# Patient Record
Sex: Female | Born: 1950 | Smoking: Never smoker
Health system: Southern US, Community
[De-identification: ages and names within clinical notes are randomized; demographics above are authoritative.]

## PROBLEM LIST (undated history)

## (undated) DIAGNOSIS — G709 Myoneural disorder, unspecified: Secondary | ICD-10-CM

## (undated) DIAGNOSIS — E119 Type 2 diabetes mellitus without complications: Secondary | ICD-10-CM

## (undated) DIAGNOSIS — I10 Essential (primary) hypertension: Secondary | ICD-10-CM

## (undated) DIAGNOSIS — C801 Malignant (primary) neoplasm, unspecified: Secondary | ICD-10-CM

## (undated) DIAGNOSIS — M1990 Unspecified osteoarthritis, unspecified site: Secondary | ICD-10-CM

## (undated) HISTORY — PX: CARPAL TUNNEL RELEASE: SHX101

## (undated) HISTORY — PX: JOINT REPLACEMENT: SHX530

## (undated) HISTORY — PX: ABDOMINAL HYSTERECTOMY: SHX81

## (undated) HISTORY — PX: OTHER SURGICAL HISTORY: SHX169

## (undated) HISTORY — PX: CHOLECYSTECTOMY: SHX55

---

## 2016-03-26 HISTORY — PX: EYE SURGERY: SHX253

## 2018-02-25 ENCOUNTER — Other Ambulatory Visit: Payer: Self-pay | Admitting: Neurological Surgery

## 2018-03-20 NOTE — Pre-Procedure Instructions (Signed)
Markeeta Scalf  03/20/2018      [place], [place] [address] Phone: [phone] Fax: [phone]    Your procedure is scheduled on March 28, 2018.  Report to [facility] Admitting at 1030 AM.  Call this number if you have problems the morning of surgery:  [phone]   Remember:  Do not eat or drink after midnight.    Take these medicines the morning of surgery with A SIP OF WATER  Cetirizine (Zyrtec) Sertraline (Zoloft)  7 days prior to surgery STOP taking any Aspirin (unless otherwise instructed by your surgeon), diclfenac (voltaren), Aleve, Naproxen, Ibuprofen, Motrin, Advil, Goody's, BC's, all herbal medications, fish oil, and all vitamins  WHAT DO I DO ABOUT MY DIABETES MEDICATION?  Marland Kitchen Do not take oral diabetes medicines (pills) the morning of surgery glimepiride (amaryl).  THE NIGHT BEFORE SURGERY, do not take evening dose of glimepiride.   . The day of surgery, do not take other diabetes injectables, including Byetta (exenatide), Bydureon (exenatide ER), Victoza (liraglutide), or Trulicity (dulaglutide).  Reviewed and Endorsed by [facility] Patient Education Committee, August 2015  How to Manage Your Diabetes Before and After Surgery  Why is it important to control my blood sugar before and after surgery? . Improving blood sugar levels before and after surgery helps healing and can limit problems. . A way of improving blood sugar control is eating a healthy diet by: o  Eating less sugar and carbohydrates o  Increasing activity/exercise o  Talking with your doctor about reaching your blood sugar goals . High blood sugars (greater than 180 mg/dL) can raise your risk of infections and slow your recovery, so you will need to focus on controlling your diabetes during the weeks before surgery. . Make sure that the doctor who takes care of your diabetes knows  about your planned surgery including the date and location.  How do I manage my blood sugar before surgery? . Check your blood sugar at least 4 times a day, starting 2 days before surgery, to make sure that the level is not too high or low. o Check your blood sugar the morning of your surgery when you wake up and every 2 hours until you get to the Short Stay unit. . If your blood sugar is less than 70 mg/dL, you will need to treat for low blood sugar: o Do not take insulin. o Treat a low blood sugar (less than 70 mg/dL) with  cup of clear juice (cranberry or apple), 4 glucose tablets, OR glucose gel. Recheck blood sugar in 15 minutes after treatment (to make sure it is greater than 70 mg/dL). If your blood sugar is not greater than 70 mg/dL on recheck, call [phone] o  for further instructions. . Report your blood sugar to the short stay nurse when you get to Short Stay.  . If you are admitted to the hospital after surgery: o Your blood sugar will be checked by the staff and you will probably be given insulin after surgery (instead of oral diabetes medicines) to make sure you have good blood sugar levels. o The goal for blood sugar control after surgery is 80-180 mg/dL.   [facility]- Preparing For Surgery  Before surgery, you can play an important role. Because skin is not sterile, your skin needs to be as free of germs as possible. You can reduce the number of germs  on your skin by washing with CHG (chlorahexidine gluconate) Soap before surgery.  CHG is an antiseptic cleaner which kills germs and bonds with the skin to continue killing germs even after washing.    Oral Hygiene is also important to reduce your risk of infection.  Remember - BRUSH YOUR TEETH THE MORNING OF SURGERY WITH YOUR REGULAR TOOTHPASTE  Please do not use if you have an allergy to CHG or antibacterial soaps. If your skin becomes reddened/irritated stop using the CHG.  Do not shave (including legs and underarms) for  at least 48 hours prior to first CHG shower. It is OK to shave your face.  Please follow these instructions carefully.   1. Shower the NIGHT BEFORE SURGERY and the MORNING OF SURGERY with CHG.   2. If you chose to wash your hair, wash your hair first as usual with your normal shampoo.  3. After you shampoo, rinse your hair and body thoroughly to remove the shampoo.  4. Use CHG as you would any other liquid soap. You can apply CHG directly to the skin and wash gently with a scrungie or a clean washcloth.   5. Apply the CHG Soap to your body ONLY FROM THE NECK DOWN.  Do not use on open wounds or open sores. Avoid contact with your eyes, ears, mouth and genitals (private parts). Wash Face and genitals (private parts)  with your normal soap.  6. Wash thoroughly, paying special attention to the area where your surgery will be performed.  7. Thoroughly rinse your body with warm water from the neck down.  8. DO NOT shower/wash with your normal soap after using and rinsing off the CHG Soap.  9. Pat yourself dry with a CLEAN TOWEL.  10. Wear CLEAN PAJAMAS to bed the night before surgery, wear comfortable clothes the morning of surgery  11. Place CLEAN SHEETS on your bed the night of your first shower and DO NOT SLEEP WITH PETS.  Day of Surgery:  Do not apply any deodorants/lotions.  Please wear clean clothes to the hospital/surgery center.   Remember to brush your teeth WITH YOUR REGULAR TOOTHPASTE.    Do not wear jewelry, make-up or nail polish.  Do not wear lotions, powders, or perfumes, or deodorant.  Do not shave 48 hours prior to surgery.    Do not bring valuables to the hospital.  [facility] is not responsible for any belongings or valuables.  Contacts, dentures or bridgework may not be worn into surgery.  Leave your suitcase in the car.  After surgery it may be brought to your room.  For patients admitted to the hospital, discharge time will be determined by your treatment  team.  Patients discharged the day of surgery will not be allowed to drive home.   Please read over the following fact sheets that you were given.

## 2018-03-20 NOTE — Progress Notes (Addendum)
PCP -  Ardith Dark, PA-C Cardiologist - pt denies  Chest x-ray - 03/21/18 at PAT appointment EKG - 03/21/18 at PAT appt  Stress Test -  Pt denies ECHO - pt denies  Cardiac Cath - pt denies  Sleep Study - maybe in 1990s CPAP - n/a  Fasting Blood Sugar - 100s Checks Blood Sugar 0 times a day-doesn't test at home Dr. Posey Pronto at [facility]   Blood Thinner Instructions: pt denies Aspirin Instructions: pt denies  Anesthesia review:   Patient denies shortness of breath, fever, cough and chest pain at PAT appointment  Patient verbalized understanding of instructions that were given to them at the PAT appointment. Patient was also instructed that they will need to review over the PAT instructions again at home before surgery.

## 2018-03-21 ENCOUNTER — Encounter (HOSPITAL_COMMUNITY)
Admission: RE | Admit: 2018-03-21 | Discharge: 2018-03-21 | Disposition: A | Discharge status: Home / Self Care | Payer: Medicare HMO | Source: Ambulatory Visit | Attending: Neurological Surgery | Admitting: Neurological Surgery

## 2018-03-21 ENCOUNTER — Encounter (HOSPITAL_COMMUNITY): Payer: Self-pay

## 2018-03-21 ENCOUNTER — Other Ambulatory Visit: Payer: Self-pay

## 2018-03-21 DIAGNOSIS — M4310 Spondylolisthesis, site unspecified: Secondary | ICD-10-CM | POA: Insufficient documentation

## 2018-03-21 DIAGNOSIS — Z7984 Long term (current) use of oral hypoglycemic drugs: Secondary | ICD-10-CM | POA: Diagnosis not present

## 2018-03-21 DIAGNOSIS — E119 Type 2 diabetes mellitus without complications: Secondary | ICD-10-CM | POA: Diagnosis not present

## 2018-03-21 DIAGNOSIS — I10 Essential (primary) hypertension: Secondary | ICD-10-CM | POA: Diagnosis not present

## 2018-03-21 DIAGNOSIS — Z79899 Other long term (current) drug therapy: Secondary | ICD-10-CM | POA: Insufficient documentation

## 2018-03-21 DIAGNOSIS — Z01818 Encounter for other preprocedural examination: Secondary | ICD-10-CM | POA: Diagnosis not present

## 2018-03-21 HISTORY — DX: Essential (primary) hypertension: I10

## 2018-03-21 HISTORY — DX: Unspecified osteoarthritis, unspecified site: M19.90

## 2018-03-21 HISTORY — DX: Type 2 diabetes mellitus without complications: E11.9

## 2018-03-21 HISTORY — DX: Myoneural disorder, unspecified: G70.9

## 2018-03-21 HISTORY — DX: Malignant (primary) neoplasm, unspecified: C80.1

## 2018-03-21 LAB — CBC WITH DIFFERENTIAL/PLATELET
Abs Immature Granulocytes: 0.02 10*3/uL (ref 0.00–0.07)
BASOS PCT: 1 %
Basophils Absolute: 0.1 10*3/uL (ref 0.0–0.1)
EOS ABS: 0.3 10*3/uL (ref 0.0–0.5)
Eosinophils Relative: 3 %
HCT: 43 % (ref 36.0–46.0)
Hemoglobin: 13.6 g/dL (ref 12.0–15.0)
Immature Granulocytes: 0 %
Lymphocytes Relative: 25 %
Lymphs Abs: 2.3 10*3/uL (ref 0.7–4.0)
MCH: 27.3 pg (ref 26.0–34.0)
MCHC: 31.6 g/dL (ref 30.0–36.0)
MCV: 86.3 fL (ref 80.0–100.0)
Monocytes Absolute: 0.6 10*3/uL (ref 0.1–1.0)
Monocytes Relative: 6 %
Neutro Abs: 6 10*3/uL (ref 1.7–7.7)
Neutrophils Relative %: 65 %
PLATELETS: 334 10*3/uL (ref 150–400)
RBC: 4.98 MIL/uL (ref 3.87–5.11)
RDW: 12.8 % (ref 11.5–15.5)
WBC: 9.2 10*3/uL (ref 4.0–10.5)
nRBC: 0 % (ref 0.0–0.2)

## 2018-03-21 LAB — GLUCOSE, CAPILLARY: Glucose-Capillary: 125 mg/dL — ABNORMAL HIGH (ref 70–99)

## 2018-03-21 LAB — TYPE AND SCREEN
ABO/RH(D): A POS
Antibody Screen: NEGATIVE

## 2018-03-21 LAB — BASIC METABOLIC PANEL
Anion gap: 10 (ref 5–15)
BUN: 17 mg/dL (ref 8–23)
CALCIUM: 9.2 mg/dL (ref 8.9–10.3)
CO2: 24 mmol/L (ref 22–32)
Chloride: 106 mmol/L (ref 98–111)
Creatinine, Ser: 0.93 mg/dL (ref 0.44–1.00)
GFR calc Af Amer: >60 mL/min (ref >60)
GFR calc non Af Amer: >60 mL/min (ref >60)
Glucose, Bld: 141 mg/dL — ABNORMAL HIGH (ref 70–99)
Potassium: 4.5 mmol/L (ref 3.5–5.1)
Sodium: 140 mmol/L (ref 135–145)

## 2018-03-21 LAB — HEMOGLOBIN A1C
Hgb A1c MFr Bld: 7 % — ABNORMAL HIGH (ref 4.8–5.6)
Mean Plasma Glucose: 154.2 mg/dL

## 2018-03-21 LAB — PROTIME-INR
INR: 1.01
Prothrombin Time: 13.2 seconds (ref 11.4–15.2)

## 2018-03-21 LAB — SURGICAL PCR SCREEN
MRSA, PCR: NEGATIVE
Staphylococcus aureus: NEGATIVE

## 2018-03-21 LAB — ABO/RH: ABO/RH(D): A POS

## 2018-03-27 ENCOUNTER — Encounter (HOSPITAL_COMMUNITY): Payer: Self-pay | Admitting: Anesthesiology

## 2018-03-27 NOTE — Anesthesia Preprocedure Evaluation (Addendum)
Anesthesia Evaluation  Patient identified by MRN, date of birth, ID band Patient awake    Reviewed: Allergy & Precautions, NPO status , Patient's Chart, lab work & pertinent test results  Airway Mallampati: I       Dental  (+) Lower Dentures, Upper Dentures, Dental Advisory Given, Implants   Pulmonary    Pulmonary exam normal breath sounds clear to auscultation       Cardiovascular hypertension, Pt. on medications Normal cardiovascular exam Rhythm:Regular Rate:Normal     Neuro/Psych    GI/Hepatic negative GI ROS, Neg liver ROS,   Endo/Other  diabetes, Oral Hypoglycemic Agents  Renal/GU negative Renal ROS     Musculoskeletal   Abdominal (+) + obese,   Peds  Hematology   Anesthesia Other Findings   Reproductive/Obstetrics                           Anesthesia Physical Anesthesia Plan  ASA: II  Anesthesia Plan: General   Post-op Pain Management:    Induction: Intravenous  PONV Risk Score and Plan: 4 or greater and Ondansetron, Midazolam and Treatment may vary due to age or medical condition  Airway Management Planned: Oral ETT  Additional Equipment:   Intra-op Plan:   Post-operative Plan: Extubation in OR  Informed Consent: I have reviewed the patients History and Physical, chart, labs and discussed the procedure including the risks, benefits and alternatives for the proposed anesthesia with the patient or authorized representative who has indicated his/her understanding and acceptance.   Dental advisory given  Plan Discussed with: CRNA  Anesthesia Plan Comments:        Anesthesia Quick Evaluation

## 2018-03-28 ENCOUNTER — Inpatient Hospital Stay (HOSPITAL_COMMUNITY): Payer: Medicare HMO

## 2018-03-28 ENCOUNTER — Encounter (HOSPITAL_COMMUNITY): Payer: Self-pay

## 2018-03-28 ENCOUNTER — Inpatient Hospital Stay (HOSPITAL_COMMUNITY)
Admission: RE | Admit: 2018-03-28 | Discharge: 2018-03-29 | DRG: 455 | Disposition: A | Discharge status: Home / Self Care | Payer: Medicare HMO | Attending: Neurological Surgery | Admitting: Neurological Surgery

## 2018-03-28 ENCOUNTER — Encounter (HOSPITAL_COMMUNITY)
Admission: RE | Disposition: A | Discharge status: Home / Self Care | Payer: Self-pay | Source: Home / Self Care | Attending: Neurological Surgery

## 2018-03-28 ENCOUNTER — Inpatient Hospital Stay (HOSPITAL_COMMUNITY): Payer: Medicare HMO | Admitting: Certified Registered Nurse Anesthetist

## 2018-03-28 ENCOUNTER — Other Ambulatory Visit: Payer: Self-pay

## 2018-03-28 DIAGNOSIS — E119 Type 2 diabetes mellitus without complications: Secondary | ICD-10-CM | POA: Diagnosis present

## 2018-03-28 DIAGNOSIS — I10 Essential (primary) hypertension: Secondary | ICD-10-CM | POA: Diagnosis present

## 2018-03-28 DIAGNOSIS — Z79899 Other long term (current) drug therapy: Secondary | ICD-10-CM | POA: Diagnosis not present

## 2018-03-28 DIAGNOSIS — M48061 Spinal stenosis, lumbar region without neurogenic claudication: Secondary | ICD-10-CM | POA: Diagnosis present

## 2018-03-28 DIAGNOSIS — M5116 Intervertebral disc disorders with radiculopathy, lumbar region: Secondary | ICD-10-CM | POA: Diagnosis present

## 2018-03-28 DIAGNOSIS — M4316 Spondylolisthesis, lumbar region: Principal | ICD-10-CM | POA: Diagnosis present

## 2018-03-28 DIAGNOSIS — Z7984 Long term (current) use of oral hypoglycemic drugs: Secondary | ICD-10-CM

## 2018-03-28 DIAGNOSIS — Z85828 Personal history of other malignant neoplasm of skin: Secondary | ICD-10-CM

## 2018-03-28 DIAGNOSIS — Z981 Arthrodesis status: Secondary | ICD-10-CM

## 2018-03-28 DIAGNOSIS — Z419 Encounter for procedure for purposes other than remedying health state, unspecified: Secondary | ICD-10-CM

## 2018-03-28 LAB — GLUCOSE, CAPILLARY
GLUCOSE-CAPILLARY: 150 mg/dL — AB (ref 70–99)
GLUCOSE-CAPILLARY: 224 mg/dL — AB (ref 70–99)
Glucose-Capillary: 331 mg/dL — ABNORMAL HIGH (ref 70–99)
Glucose-Capillary: 249 mg/dL — ABNORMAL HIGH (ref 70–99)

## 2018-03-28 SURGERY — POSTERIOR LUMBAR FUSION 2 LEVEL
Anesthesia: General | Site: Back

## 2018-03-28 MED ORDER — FENTANYL CITRATE (PF) 250 MCG/5ML IJ SOLN
INTRAMUSCULAR | Status: AC
  Filled 2018-03-28: qty 5

## 2018-03-28 MED ORDER — ROCURONIUM BROMIDE 50 MG/5ML IV SOSY
PREFILLED_SYRINGE | INTRAVENOUS | Status: AC
  Filled 2018-03-28: qty 5

## 2018-03-28 MED ORDER — CHLORHEXIDINE GLUCONATE CLOTH 2 % EX PADS: 6.0000 | MEDICATED_PAD | Freq: Once | CUTANEOUS | Status: DC

## 2018-03-28 MED ORDER — OXYCODONE HCL 5 MG PO TABS
5.0000 mg | ORAL_TABLET | ORAL | Status: DC | PRN
  Administered 2018-03-28 – 2018-03-29 (×5): 10 mg via ORAL
  Filled 2018-03-28 (×5): qty 2

## 2018-03-28 MED ORDER — MIDAZOLAM HCL 5 MG/5ML IJ SOLN
INTRAMUSCULAR | Status: DC | PRN
  Administered 2018-03-28: 2 mg via INTRAVENOUS

## 2018-03-28 MED ORDER — SODIUM CHLORIDE 0.9 % IV SOLN
INTRAVENOUS | Status: DC | PRN
  Administered 2018-03-28: 60 ug/min via INTRAVENOUS
  Administered 2018-03-28: 11:00:00 via INTRAVENOUS

## 2018-03-28 MED ORDER — MIDAZOLAM HCL 2 MG/2ML IJ SOLN
INTRAMUSCULAR | Status: AC
  Filled 2018-03-28: qty 2

## 2018-03-28 MED ORDER — VANCOMYCIN HCL 1000 MG IV SOLR
INTRAVENOUS | Status: AC
  Filled 2018-03-28: qty 1000

## 2018-03-28 MED ORDER — METHOCARBAMOL 500 MG PO TABS
500.0000 mg | ORAL_TABLET | Freq: Four times a day (QID) | ORAL | Status: DC | PRN
  Administered 2018-03-28 – 2018-03-29 (×4): 500 mg via ORAL
  Filled 2018-03-28 (×3): qty 1

## 2018-03-28 MED ORDER — ALBUMIN HUMAN 5 % IV SOLN
INTRAVENOUS | Status: DC | PRN
  Administered 2018-03-28 (×2): via INTRAVENOUS

## 2018-03-28 MED ORDER — SERTRALINE HCL 50 MG PO TABS: 50.0000 mg | ORAL_TABLET | Freq: Every day | ORAL | Status: DC

## 2018-03-28 MED ORDER — METHOCARBAMOL 500 MG PO TABS
ORAL_TABLET | ORAL | Status: AC
  Filled 2018-03-28: qty 1

## 2018-03-28 MED ORDER — SENNA 8.6 MG PO TABS
1.0000 | ORAL_TABLET | Freq: Two times a day (BID) | ORAL | Status: DC
  Administered 2018-03-28: 8.6 mg via ORAL
  Filled 2018-03-28: qty 1

## 2018-03-28 MED ORDER — DEXAMETHASONE SODIUM PHOSPHATE 4 MG/ML IJ SOLN
INTRAMUSCULAR | Status: DC | PRN
  Administered 2018-03-28: 10 mg via INTRAVENOUS

## 2018-03-28 MED ORDER — INSULIN ASPART 100 UNIT/ML ~~LOC~~ SOLN
0.0000 [IU] | Freq: Every day | SUBCUTANEOUS | Status: DC
  Administered 2018-03-28: 2 [IU] via SUBCUTANEOUS

## 2018-03-28 MED ORDER — THROMBIN 20000 UNITS EX SOLR
CUTANEOUS | Status: AC
  Filled 2018-03-28: qty 20000

## 2018-03-28 MED ORDER — PROPOFOL 10 MG/ML IV BOLUS
INTRAVENOUS | Status: DC | PRN
  Administered 2018-03-28: 120 mg via INTRAVENOUS

## 2018-03-28 MED ORDER — MENTHOL 3 MG MT LOZG: 1.0000 | LOZENGE | OROMUCOSAL | Status: DC | PRN

## 2018-03-28 MED ORDER — BUPIVACAINE HCL (PF) 0.25 % IJ SOLN
INTRAMUSCULAR | Status: AC
  Filled 2018-03-28: qty 30

## 2018-03-28 MED ORDER — CEFAZOLIN SODIUM-DEXTROSE 2-4 GM/100ML-% IV SOLN
2.0000 g | Freq: Three times a day (TID) | INTRAVENOUS | Status: AC
  Administered 2018-03-28 (×2): 2 g via INTRAVENOUS
  Filled 2018-03-28 (×2): qty 100

## 2018-03-28 MED ORDER — GLIMEPIRIDE 2 MG PO TABS
4.0000 mg | ORAL_TABLET | Freq: Two times a day (BID) | ORAL | Status: DC
  Administered 2018-03-29: 4 mg via ORAL
  Filled 2018-03-28: qty 2

## 2018-03-28 MED ORDER — ACETAMINOPHEN 10 MG/ML IV SOLN
INTRAVENOUS | Status: AC
  Filled 2018-03-28: qty 100

## 2018-03-28 MED ORDER — PHENYLEPHRINE 40 MCG/ML (10ML) SYRINGE FOR IV PUSH (FOR BLOOD PRESSURE SUPPORT)
PREFILLED_SYRINGE | INTRAVENOUS | Status: AC
  Filled 2018-03-28: qty 10

## 2018-03-28 MED ORDER — CELECOXIB 200 MG PO CAPS
200.0000 mg | ORAL_CAPSULE | Freq: Two times a day (BID) | ORAL | Status: DC
  Administered 2018-03-28 – 2018-03-29 (×3): 200 mg via ORAL
  Filled 2018-03-28 (×3): qty 1

## 2018-03-28 MED ORDER — ONDANSETRON HCL 4 MG/2ML IJ SOLN
INTRAMUSCULAR | Status: DC | PRN
  Administered 2018-03-28: 4 mg via INTRAVENOUS

## 2018-03-28 MED ORDER — HEPARIN SODIUM (PORCINE) 1000 UNIT/ML IJ SOLN
INTRAMUSCULAR | Status: AC
  Filled 2018-03-28: qty 1

## 2018-03-28 MED ORDER — PHENOL 1.4 % MT LIQD: 1.0000 | OROMUCOSAL | Status: DC | PRN

## 2018-03-28 MED ORDER — GLYCOPYRROLATE PF 0.2 MG/ML IJ SOSY
PREFILLED_SYRINGE | INTRAMUSCULAR | Status: AC
  Filled 2018-03-28: qty 1

## 2018-03-28 MED ORDER — KETOROLAC TROMETHAMINE 30 MG/ML IJ SOLN
30.0000 mg | Freq: Once | INTRAMUSCULAR | Status: AC | PRN
  Administered 2018-03-28: 30 mg via INTRAVENOUS

## 2018-03-28 MED ORDER — SODIUM CHLORIDE 0.9% FLUSH: 3.0000 mL | Freq: Two times a day (BID) | INTRAVENOUS | Status: DC

## 2018-03-28 MED ORDER — SODIUM CHLORIDE 0.9% FLUSH: 3.0000 mL | INTRAVENOUS | Status: DC | PRN

## 2018-03-28 MED ORDER — KETOROLAC TROMETHAMINE 30 MG/ML IJ SOLN
INTRAMUSCULAR | Status: AC
  Filled 2018-03-28: qty 1

## 2018-03-28 MED ORDER — PROMETHAZINE HCL 25 MG/ML IJ SOLN: 6.2500 mg | INTRAMUSCULAR | Status: DC | PRN

## 2018-03-28 MED ORDER — FENTANYL CITRATE (PF) 100 MCG/2ML IJ SOLN
INTRAMUSCULAR | Status: DC | PRN
  Administered 2018-03-28: 100 ug via INTRAVENOUS
  Administered 2018-03-28 (×2): 50 ug via INTRAVENOUS
  Administered 2018-03-28: 150 ug via INTRAVENOUS
  Administered 2018-03-28: 50 ug via INTRAVENOUS

## 2018-03-28 MED ORDER — ONDANSETRON HCL 4 MG/2ML IJ SOLN
INTRAMUSCULAR | Status: AC
  Filled 2018-03-28: qty 2

## 2018-03-28 MED ORDER — LACTATED RINGERS IV SOLN
INTRAVENOUS | Status: DC | PRN
  Administered 2018-03-28 (×2): via INTRAVENOUS

## 2018-03-28 MED ORDER — METHOCARBAMOL 1000 MG/10ML IJ SOLN
500.0000 mg | Freq: Four times a day (QID) | INTRAVENOUS | Status: DC | PRN
  Filled 2018-03-28: qty 5

## 2018-03-28 MED ORDER — DEXAMETHASONE SODIUM PHOSPHATE 10 MG/ML IJ SOLN
INTRAMUSCULAR | Status: AC
  Filled 2018-03-28: qty 1

## 2018-03-28 MED ORDER — LIDOCAINE 2% (20 MG/ML) 5 ML SYRINGE
INTRAMUSCULAR | Status: AC
  Filled 2018-03-28: qty 5

## 2018-03-28 MED ORDER — THROMBIN 20000 UNITS EX SOLR
CUTANEOUS | Status: DC | PRN
  Administered 2018-03-28: 07:00:00 via TOPICAL

## 2018-03-28 MED ORDER — ACETAMINOPHEN 10 MG/ML IV SOLN
INTRAVENOUS | Status: DC | PRN
  Administered 2018-03-28: 1000 mg via INTRAVENOUS

## 2018-03-28 MED ORDER — ACETAMINOPHEN 325 MG PO TABS: 650.0000 mg | ORAL_TABLET | ORAL | Status: DC | PRN

## 2018-03-28 MED ORDER — SODIUM CHLORIDE 0.9 % IV SOLN: 250.0000 mL | INTRAVENOUS | Status: DC

## 2018-03-28 MED ORDER — BUPIVACAINE HCL (PF) 0.25 % IJ SOLN
INTRAMUSCULAR | Status: DC | PRN
  Administered 2018-03-28: 12 mL

## 2018-03-28 MED ORDER — THROMBIN 5000 UNITS EX SOLR
CUTANEOUS | Status: AC
  Filled 2018-03-28: qty 5000

## 2018-03-28 MED ORDER — POTASSIUM CHLORIDE IN NACL 20-0.9 MEQ/L-% IV SOLN: INTRAVENOUS | Status: DC

## 2018-03-28 MED ORDER — OXYCODONE HCL 5 MG PO TABS: 5.0000 mg | ORAL_TABLET | ORAL | Status: DC | PRN

## 2018-03-28 MED ORDER — THROMBIN 5000 UNITS EX SOLR
OROMUCOSAL | Status: DC | PRN
  Administered 2018-03-28: 08:00:00 via TOPICAL

## 2018-03-28 MED ORDER — GLYCOPYRROLATE PF 0.2 MG/ML IJ SOSY
PREFILLED_SYRINGE | INTRAMUSCULAR | Status: DC | PRN
  Administered 2018-03-28: .1 mg via INTRAVENOUS

## 2018-03-28 MED ORDER — HYDROMORPHONE HCL 1 MG/ML IJ SOLN
0.2500 mg | INTRAMUSCULAR | Status: DC | PRN
  Administered 2018-03-28 (×2): 0.5 mg via INTRAVENOUS

## 2018-03-28 MED ORDER — LISINOPRIL 20 MG PO TABS: 20.0000 mg | ORAL_TABLET | Freq: Every day | ORAL | Status: DC

## 2018-03-28 MED ORDER — HYDROMORPHONE HCL 1 MG/ML IJ SOLN
INTRAMUSCULAR | Status: AC
  Filled 2018-03-28: qty 1

## 2018-03-28 MED ORDER — CEFAZOLIN SODIUM-DEXTROSE 2-4 GM/100ML-% IV SOLN
2.0000 g | INTRAVENOUS | Status: AC
  Administered 2018-03-28: 2 g via INTRAVENOUS
  Filled 2018-03-28: qty 100

## 2018-03-28 MED ORDER — MORPHINE SULFATE (PF) 2 MG/ML IV SOLN
2.0000 mg | INTRAVENOUS | Status: DC | PRN
  Administered 2018-03-28: 2 mg via INTRAVENOUS
  Filled 2018-03-28: qty 1

## 2018-03-28 MED ORDER — 0.9 % SODIUM CHLORIDE (POUR BTL) OPTIME
TOPICAL | Status: DC | PRN
  Administered 2018-03-28: 1000 mL

## 2018-03-28 MED ORDER — HEPARIN SODIUM (PORCINE) 1000 UNIT/ML IJ SOLN
INTRAMUSCULAR | Status: DC | PRN
  Administered 2018-03-28: 5000 [IU] via INTRAVENOUS

## 2018-03-28 MED ORDER — SODIUM CHLORIDE 0.9 % IV SOLN
INTRAVENOUS | Status: DC | PRN
  Administered 2018-03-28: 07:00:00

## 2018-03-28 MED ORDER — INSULIN ASPART 100 UNIT/ML ~~LOC~~ SOLN
0.0000 [IU] | Freq: Three times a day (TID) | SUBCUTANEOUS | Status: DC
  Administered 2018-03-28: 11 [IU] via SUBCUTANEOUS

## 2018-03-28 MED ORDER — MEPERIDINE HCL 50 MG/ML IJ SOLN: 6.2500 mg | INTRAMUSCULAR | Status: DC | PRN

## 2018-03-28 MED ORDER — SUGAMMADEX SODIUM 200 MG/2ML IV SOLN
INTRAVENOUS | Status: DC | PRN
  Administered 2018-03-28: 200 mg via INTRAVENOUS

## 2018-03-28 MED ORDER — ACETAMINOPHEN 650 MG RE SUPP: 650.0000 mg | RECTAL | Status: DC | PRN

## 2018-03-28 MED ORDER — DEXAMETHASONE SODIUM PHOSPHATE 10 MG/ML IJ SOLN
10.0000 mg | INTRAMUSCULAR | Status: DC
  Filled 2018-03-28: qty 1

## 2018-03-28 MED ORDER — VANCOMYCIN HCL 1000 MG IV SOLR
INTRAVENOUS | Status: DC | PRN
  Administered 2018-03-28: 1000 mg

## 2018-03-28 MED ORDER — ONDANSETRON HCL 4 MG PO TABS: 4.0000 mg | ORAL_TABLET | Freq: Four times a day (QID) | ORAL | Status: DC | PRN

## 2018-03-28 MED ORDER — EPHEDRINE 5 MG/ML INJ
INTRAVENOUS | Status: AC
  Filled 2018-03-28: qty 10

## 2018-03-28 MED ORDER — ONDANSETRON HCL 4 MG/2ML IJ SOLN: 4.0000 mg | Freq: Four times a day (QID) | INTRAMUSCULAR | Status: DC | PRN

## 2018-03-28 MED ORDER — PHENYLEPHRINE 40 MCG/ML (10ML) SYRINGE FOR IV PUSH (FOR BLOOD PRESSURE SUPPORT)
PREFILLED_SYRINGE | INTRAVENOUS | Status: DC | PRN
  Administered 2018-03-28 (×3): 80 ug via INTRAVENOUS
  Administered 2018-03-28: 120 ug via INTRAVENOUS
  Administered 2018-03-28 (×2): 80 ug via INTRAVENOUS

## 2018-03-28 MED ORDER — INSULIN ASPART 100 UNIT/ML ~~LOC~~ SOLN
0.0000 [IU] | Freq: Three times a day (TID) | SUBCUTANEOUS | Status: DC
  Administered 2018-03-29: 2 [IU] via SUBCUTANEOUS

## 2018-03-28 MED ORDER — ROCURONIUM BROMIDE 50 MG/5ML IV SOSY
PREFILLED_SYRINGE | INTRAVENOUS | Status: DC | PRN
  Administered 2018-03-28: 40 mg via INTRAVENOUS
  Administered 2018-03-28: 10 mg via INTRAVENOUS
  Administered 2018-03-28: 50 mg via INTRAVENOUS

## 2018-03-28 MED ORDER — SODIUM CHLORIDE (PF) 0.9 % IJ SOLN
INTRAMUSCULAR | Status: DC | PRN
  Administered 2018-03-28: 5 mL

## 2018-03-28 MED ORDER — PROPOFOL 10 MG/ML IV BOLUS
INTRAVENOUS | Status: AC
  Filled 2018-03-28: qty 20

## 2018-03-28 MED ORDER — LIDOCAINE 2% (20 MG/ML) 5 ML SYRINGE
INTRAMUSCULAR | Status: DC | PRN
  Administered 2018-03-28: 80 mg via INTRAVENOUS

## 2018-03-28 MED ORDER — EPHEDRINE SULFATE-NACL 50-0.9 MG/10ML-% IV SOSY
PREFILLED_SYRINGE | INTRAVENOUS | Status: DC | PRN
  Administered 2018-03-28: 10 mg via INTRAVENOUS
  Administered 2018-03-28: 5 mg via INTRAVENOUS

## 2018-03-28 SURGICAL SUPPLY — 71 items
BAG DECANTER FOR FLEXI CONT (MISCELLANEOUS) ×3 IMPLANT
BASKET BONE COLLECTION (BASKET) ×6 IMPLANT
BENZOIN TINCTURE PRP APPL 2/3 (GAUZE/BANDAGES/DRESSINGS) ×3 IMPLANT
BLADE CLIPPER SURG (BLADE) IMPLANT
BUR MATCHSTICK NEURO 3.0 LAGG (BURR) ×3 IMPLANT
CANISTER SUCT 3000ML PPV (MISCELLANEOUS) ×3 IMPLANT
CARTRIDGE OIL MAESTRO DRILL (MISCELLANEOUS) ×1 IMPLANT
CLOSURE WOUND 1/2 X4 (GAUZE/BANDAGES/DRESSINGS) ×1
CONT SPEC 4OZ CLIKSEAL STRL BL (MISCELLANEOUS) ×3 IMPLANT
COVER BACK TABLE 60X90IN (DRAPES) ×3 IMPLANT
COVER WAND RF STERILE (DRAPES) ×3 IMPLANT
DERMABOND ADVANCED (GAUZE/BANDAGES/DRESSINGS) ×2
DERMABOND ADVANCED .7 DNX12 (GAUZE/BANDAGES/DRESSINGS) ×1 IMPLANT
DIFFUSER DRILL AIR PNEUMATIC (MISCELLANEOUS) ×3 IMPLANT
DRAPE C-ARM 42X72 X-RAY (DRAPES) ×3 IMPLANT
DRAPE C-ARMOR (DRAPES) ×3 IMPLANT
DRAPE LAPAROTOMY 100X72X124 (DRAPES) ×3 IMPLANT
DRAPE POUCH INSTRU U-SHP 10X18 (DRAPES) ×3 IMPLANT
DRAPE SURG 17X23 STRL (DRAPES) ×3 IMPLANT
DRSG OPSITE POSTOP 4X6 (GAUZE/BANDAGES/DRESSINGS) ×3 IMPLANT
DURAPREP 26ML APPLICATOR (WOUND CARE) ×3 IMPLANT
ELECT REM PT RETURN 9FT ADLT (ELECTROSURGICAL) ×3
ELECTRODE REM PT RTRN 9FT ADLT (ELECTROSURGICAL) ×1 IMPLANT
EVACUATOR 1/8 PVC DRAIN (DRAIN) ×3 IMPLANT
GAUZE 4X4 16PLY RFD (DISPOSABLE) IMPLANT
GLOVE BIO SURGEON STRL SZ7 (GLOVE) IMPLANT
GLOVE BIO SURGEON STRL SZ8 (GLOVE) ×6 IMPLANT
GLOVE BIOGEL PI IND STRL 7.0 (GLOVE) IMPLANT
GLOVE BIOGEL PI IND STRL 7.5 (GLOVE) ×4 IMPLANT
GLOVE BIOGEL PI INDICATOR 7.0 (GLOVE)
GLOVE BIOGEL PI INDICATOR 7.5 (GLOVE) ×8
GLOVE INDICATOR 7.0 STRL GRN (GLOVE) ×9 IMPLANT
GOWN STRL REUS W/ TWL LRG LVL3 (GOWN DISPOSABLE) ×1 IMPLANT
GOWN STRL REUS W/ TWL XL LVL3 (GOWN DISPOSABLE) ×2 IMPLANT
GOWN STRL REUS W/TWL 2XL LVL3 (GOWN DISPOSABLE) IMPLANT
GOWN STRL REUS W/TWL LRG LVL3 (GOWN DISPOSABLE) ×2
GOWN STRL REUS W/TWL XL LVL3 (GOWN DISPOSABLE) ×4
HEMOSTAT POWDER KIT SURGIFOAM (HEMOSTASIS) ×3 IMPLANT
IDENTI PS 10X9X25 5D (Spacer) ×6 IMPLANT
IDENTI PS 11X9X25 15D (Spacer) ×6 IMPLANT
INTERLOCK LORDOTIC ROD 5.5 X70 (Rod) ×2 IMPLANT
KIT BASIN OR (CUSTOM PROCEDURE TRAY) ×3 IMPLANT
KIT BONE MRW ASP ANGEL CPRP (KITS) ×3 IMPLANT
KIT TURNOVER KIT B (KITS) ×3 IMPLANT
LORDOTIC ROD 5.5 X 70 (Rod) ×6 IMPLANT
MILL MEDIUM DISP (BLADE) ×3 IMPLANT
NEEDLE HYPO 25X1 1.5 SAFETY (NEEDLE) ×3 IMPLANT
NS IRRIG 1000ML POUR BTL (IV SOLUTION) ×3 IMPLANT
OIL CARTRIDGE MAESTRO DRILL (MISCELLANEOUS) ×3
PACK LAMINECTOMY NEURO (CUSTOM PROCEDURE TRAY) ×3 IMPLANT
PAD ARMBOARD 7.5X6 YLW CONV (MISCELLANEOUS) ×9 IMPLANT
PUTTY DBM ALLOSYNC PURE 10CC (Putty) ×6 IMPLANT
SCREW ILIAC PA 5.5X40 (Screw) ×6 IMPLANT
SCREW KODIAK 5.5X45 (Screw) ×6 IMPLANT
SCREW KODIAK 6.5X40 (Screw) ×6 IMPLANT
SET SCREW (Screw) ×12 IMPLANT
SET SCREW SPNE (Screw) ×6 IMPLANT
SPACER IDENTI PS 10X9X25 5D (Spacer) ×2 IMPLANT
SPACER IDENTI PS 11X9X25 15D (Spacer) ×2 IMPLANT
SPONGE LAP 4X18 RFD (DISPOSABLE) IMPLANT
SPONGE SURGIFOAM ABS GEL 100 (HEMOSTASIS) ×3 IMPLANT
STRIP CLOSURE SKIN 1/2X4 (GAUZE/BANDAGES/DRESSINGS) ×2 IMPLANT
SUT VIC AB 0 CT1 18XCR BRD8 (SUTURE) ×1 IMPLANT
SUT VIC AB 0 CT1 8-18 (SUTURE) ×2
SUT VIC AB 2-0 CP2 18 (SUTURE) ×3 IMPLANT
SUT VIC AB 3-0 SH 8-18 (SUTURE) ×6 IMPLANT
SYR CONTROL 10ML LL (SYRINGE) ×6 IMPLANT
TOWEL GREEN STERILE (TOWEL DISPOSABLE) ×3 IMPLANT
TOWEL GREEN STERILE FF (TOWEL DISPOSABLE) ×3 IMPLANT
TRAY FOLEY MTR SLVR 16FR STAT (SET/KITS/TRAYS/PACK) ×3 IMPLANT
WATER STERILE IRR 1000ML POUR (IV SOLUTION) ×3 IMPLANT

## 2018-03-28 NOTE — Anesthesia Procedure Notes (Signed)
Procedure Name: Intubation Date/Time: 03/28/2018 7:46 AM Performed by: Orlie Dakin, CRNA Pre-anesthesia Checklist: Patient identified, Emergency Drugs available, Suction available and Patient being monitored Patient Re-evaluated:Patient Re-evaluated prior to induction Oxygen Delivery Method: Circle system utilized Preoxygenation: Pre-oxygenation with 100% oxygen Induction Type: IV induction Ventilation: Mask ventilation without difficulty and Oral airway inserted - appropriate to patient size Laryngoscope Size: Sabra Heck and 3 Grade View: Grade I Tube type: Oral Tube size: 7.0 mm Number of attempts: 1 Airway Equipment and Method: Stylet Placement Confirmation: ETT inserted through vocal cords under direct vision,  positive ETCO2 and breath sounds checked- equal and bilateral Secured at: 22 cm Tube secured with: Tape Dental Injury: Teeth and Oropharynx as per pre-operative assessment  Comments: Noted lower front permanent implants to secure dentures.  Upper and lower dentures given to husband.

## 2018-03-28 NOTE — Op Note (Signed)
03/28/2018  11:32 AM  PATIENT:  Jacqueline Anderson  68 y.o. 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 Decompressive lumbar laminectomy L3 4 and L4-5 requiring more work than would be required for a simple exposure of the disk for PLIF in order to adequately decompress the neural elements and address the spinal stenosis 2. Posterior lumbar interbody fusion L3-4 and L4-5 using porous titanium interbody cages packed with morcellized allograft and autograft soaked with a bone marrow aspirate obtained through a separate fascial incision over the right iliac crest 3. Posterior fixation L3-L5 inclusive using Alphatec cortical pedicle screws.  4. Intertransverse arthrodesis L3-L5 using morcellized autograft and allograft.  SURGEON:  Sherley Bounds, MD  ASSISTANTS: Glenford Peers FNP  ANESTHESIA:  General  EBL: 400 ml  Total I/O In: 2000 [I.V.:1500; IV Piggyback:500] Out: 61 [Urine:125; Blood:400]  BLOOD ADMINISTERED:none  DRAINS: none   INDICATION FOR PROCEDURE: This patient presented with severe back and bilateral leg pain that was chronic and refractory to medical measures. 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 I recommended decompression and instrumented fusion to address the stenosis as well as the segmental  instability.  Patient understood the risks, benefits, and alternatives and potential outcomes and wished to proceed.  PROCEDURE DETAILS:  The patient was brought to the operating room. After induction of generalized endotracheal anesthesia the patient was rolled into the prone position on chest rolls and all pressure points were padded. The  patient's lumbar region was cleaned and then prepped with DuraPrep and draped in the usual sterile fashion. Anesthesia was injected and then a dorsal midline incision was made and carried down to the lumbosacral fascia. The fascia was opened and the paraspinous musculature was taken down in a subperiosteal fashion to expose L3 4 and L4-5. A self-retaining retractor was placed. Intraoperative fluoroscopy confirmed my level, and I started with placement of the L3 cortical pedicle screws. The pedicle screw entry zones were identified utilizing surface landmarks and  AP and lateral fluoroscopy. I scored the cortex with the high-speed drill and then used the hand drill to drill an upward and outward direction into the pedicle. I then tapped line to line. I then placed a 5.5 x 40 mm cortical pedicle screw into the pedicles of L3 bilaterally.  I then dissected in a suprafascial plane to expose the iliac crest.  Open the fascia used a Jamshidi needle to extract 60 cc of bone marrow aspirate from the iliac crest.  This was then spun down by [facility] device and 2 to 4 cc of  BMAC was soaked on morselized allograft for later arthrodesis.  I dried the hole with Surgifoam and closed the fascia.  I then turned my attention to the decompression and complete lumbar laminectomies, hemi- facetectomies, and foraminotomies were performed at L3-4 and L4-5. The patient had significant spinal stenosis and this required more work than would be required for a simple exposure of the disc for posterior lumbar interbody fusion which would only require a limited laminotomy. Much more generous decompression and generous foraminotomy was undertaken in order to adequately decompress the neural elements and address the patient's leg pain. The yellow ligament was removed to expose the underlying dura and nerve roots, and generous foraminotomies were performed to adequately decompress the  neural elements. Both the exiting and traversing nerve roots were  decompressed on both sides until a coronary dilator passed easily along the nerve roots. Once the decompression was complete, I turned my attention to the posterior lower lumbar interbody fusion. The epidural venous vasculature was coagulated and cut sharply. Disc space was incised and the initial discectomy was performed with pituitary rongeurs. The disc space was distracted with sequential distractors to a height of 10 mm at L3-4 and 11 mm at L4-5. We then used a series of scrapers and shavers to prepare the endplates for fusion. The midline was prepared with Epstein curettes. Once the complete discectomy was finished, we packed an appropriate sized interbody cage with local autograft and morcellized allograft, gently retracted the nerve root, and tapped the cage into position at L3-4 and L4-5.  The midline between the cages was packed with morselized autograft and allograft. We then turned our attention to the placement of the lower pedicle screws. The pedicle screw entry zones were identified utilizing surface landmarks and fluoroscopy. I drilled into each pedicle utilizing the hand drill, and tapped each pedicle with the appropriate tap. We palpated with a ball probe to assure no break in the cortex. We then placed 5.5 x 40 mm cortical pedicle screws at L4 and a 6.5 x 40 mm pedicle screws at L5 bilaterally. . We then decorticated the transverse processes and laid a mixture of morcellized autograft and allograft out over these to perform intertransverse arthrodesis at L3 to L5. We then placed lordotic rods into the multiaxial screw heads of the pedicle screws and locked these in position with the locking caps and anti-torque device. We then checked our construct with AP and lateral fluoroscopy. Irrigated with copious amounts of bacitracin-containing saline solution. Inspected the nerve roots once again to assure adequate decompression, lined to the dura with Gelfoam, placed powdered vancomycin into the wound,  and closed the muscle and the fascia with 0 Vicryl. Closed the subcutaneous tissues with 2-0 Vicryl and subcuticular tissues with 3-0 Vicryl. The skin was closed with benzoin and Steri-Strips. Dressing was then applied, the patient was awakened from general anesthesia and transported to the recovery room in stable condition. At the end of the procedure all sponge, needle and instrument counts were correct.   PLAN OF CARE: admit to inpatient  PATIENT DISPOSITION:  PACU - hemodynamically stable.   Delay start of Pharmacological VTE agent (>24hrs) due to surgical blood loss or risk of bleeding:  yes

## 2018-03-28 NOTE — Anesthesia Postprocedure Evaluation (Signed)
Anesthesia Post Note  Patient: Jacqueline Anderson  Procedure(s) Performed: Posterior Lumbar Interbody Fusion - Lumbar three-four Lumbar four- Lumbar five - Posterior Lateral  Interbody fusion (N/A Back)     Patient location during evaluation: PACU Anesthesia Type: General Level of consciousness: awake Pain management: pain level controlled Vital Signs Assessment: post-procedure vital signs reviewed and stable Respiratory status: spontaneous breathing Cardiovascular status: stable Postop Assessment: no apparent nausea or vomiting Anesthetic complications: no    Last Vitals:  Vitals:   03/28/18 1215 03/28/18 1220  BP:    Pulse: (!) 105 (!) 106  Resp: 20 13  Temp: 36.7 C   SpO2: 93% 91%    Last Pain:  Vitals:   03/28/18 1215  PainSc: Asleep   Pain Goal:                 Huston Foley

## 2018-03-28 NOTE — OR Nursing (Signed)
ACD-A 10 ML EXPIRES 01/22 USED WITH ARTHREX MACHINE

## 2018-03-28 NOTE — Transfer of Care (Signed)
Immediate Anesthesia Transfer of Care Note  Patient: Jacqueline Anderson  Procedure(s) Performed: Posterior Lumbar Interbody Fusion - Lumbar three-four Lumbar four- Lumbar five - Posterior Lateral  Interbody fusion (N/A Back)  Patient Location: PACU  Anesthesia Type:General  Level of Consciousness: awake, oriented and patient cooperative  Airway & Oxygen Therapy: Patient Spontanous Breathing and Patient connected to face mask oxygen  Post-op Assessment: Report given to RN, Post -op Vital signs reviewed and stable and Patient moving all extremities X 4  Post vital signs: Reviewed and stable  Last Vitals:  Vitals Value Taken Time  BP 105/63 03/28/2018 11:41 AM  Temp    Pulse 109 03/28/2018 11:42 AM  Resp 22 03/28/2018 11:42 AM  SpO2 98 % 03/28/2018 11:42 AM  Vitals shown include unvalidated device data.  Last Pain:  Vitals:   03/28/18 0620  PainSc: 0-No pain         Complications: No apparent anesthesia complications

## 2018-03-28 NOTE — H&P (Signed)
Subjective: Patient is a 68 y.o. female admitted for plif. Onset of symptoms was several months ago, gradually worsening since that time.  The pain is rated severe, and is located at the across the lower back and radiates to legs. The pain is described as aching and occurs all day. The symptoms have been progressive. Symptoms are exacerbated by exercise and standing. MRI or CT showed spondylolisthesis/ stenosis L3-4 l4-5   Past Medical History:  Diagnosis Date  . Arthritis   . Cancer ([place])    skin cancers removed  . Diabetes mellitus without complication ([place])   . Hypertension   . Neuromuscular disorder [facility])     Past Surgical History:  Procedure Laterality Date  . ABDOMINAL HYSTERECTOMY    . CARPAL TUNNEL RELEASE Bilateral   . CHOLECYSTECTOMY    . excision of skin cancer     2019 on head  . EYE SURGERY Right 2018  . JOINT REPLACEMENT Left     Prior to Admission medications   Medication Sig Start Date End Date Taking? Authorizing Provider  cetirizine (ZYRTEC) 10 MG tablet Take 10 mg by mouth daily.   Yes [provider]  diclofenac (VOLTAREN) 75 MG EC tablet Take 75 mg by mouth 2 (two) times daily.   Yes [provider]  Dulaglutide (TRULICITY) 1.5 EN/2.7PO SOPN Inject 1.5 mg into the skin every 7 (seven) days.   Yes [provider]  glimepiride (AMARYL) 4 MG tablet Take 4 mg by mouth 2 (two) times daily.   Yes [provider]  lisinopril (PRINIVIL,ZESTRIL) 20 MG tablet Take 20 mg by mouth daily.   Yes [provider]  pravastatin (PRAVACHOL) 10 MG tablet Take 10 mg by mouth daily.   Yes [provider]  sertraline (ZOLOFT) 50 MG tablet Take 50-100 mg by mouth daily.    Yes [provider]   No Known Allergies  Social History   Tobacco Use  . Smoking status: Never Smoker  . Smokeless tobacco: Never Used  Substance Use Topics  . Alcohol use: Not Currently    History reviewed. No pertinent family history.   Review  of Systems  Positive ROS: neg  All other systems have been reviewed and were otherwise negative with the exception of those mentioned in the HPI and as above.  Objective: Vital signs in last 24 hours: Temp:  [98.3 F (36.8 C)] 98.3 F (36.8 C) (01/03 0551) Pulse Rate:  [83] 83 (01/03 0551) Resp:  [17] 17 (01/03 0551) BP: (141)/(60) 141/60 (01/03 0551) SpO2:  [100 %] 100 % (01/03 0551) Weight:  [91.9 kg] 91.9 kg (01/03 0620)  General Appearance: Alert, cooperative, no distress, appears stated age Head: Normocephalic, without obvious abnormality, atraumatic Eyes: PERRL, conjunctiva/corneas clear, EOM's intact    Neck: Supple, symmetrical, trachea midline Back: Symmetric, no curvature, ROM normal, no CVA tenderness Lungs:  respirations unlabored Heart: Regular rate and rhythm Abdomen: Soft, non-tender Extremities: Extremities normal, atraumatic, no cyanosis or edema Pulses: 2+ and symmetric all extremities Skin: Skin color, texture, turgor normal, no rashes or lesions  NEUROLOGIC:   Mental status: Alert and oriented x4,  no aphasia, good attention span, fund of knowledge, and memory Motor Exam - grossly normal Sensory Exam - grossly normal Reflexes: 1+ Coordination - grossly normal Gait - grossly normal Balance - grossly normal Cranial Nerves: I: smell Not tested  II: visual acuity  OS: nl    OD: nl  II: visual fields Full to confrontation  II: pupils Equal, round,  reactive to light  III,VII: ptosis None  III,IV,VI: extraocular muscles  Full ROM  V: mastication Normal  V: facial light touch sensation  Normal  V,VII: corneal reflex  Present  VII: facial muscle function - upper  Normal  VII: facial muscle function - lower Normal  VIII: hearing Not tested  IX: soft palate elevation  Normal  IX,X: gag reflex Present  XI: trapezius strength  5/5  XI: sternocleidomastoid strength 5/5  XI: neck flexion strength  5/5  XII: tongue strength  Normal    Data Review Lab  Results  Component Value Date   WBC 9.2 03/21/2018   HGB 13.6 03/21/2018   HCT 43.0 03/21/2018   MCV 86.3 03/21/2018   PLT 334 03/21/2018   Lab Results  Component Value Date   NA 140 03/21/2018   K 4.5 03/21/2018   CL 106 03/21/2018   CO2 24 03/21/2018   BUN 17 03/21/2018   CREATININE 0.93 03/21/2018   GLUCOSE 141 (H) 03/21/2018   Lab Results  Component Value Date   INR 1.01 03/21/2018    Assessment/Plan:  Estimated body mass index is 32.19 kg/m as calculated from the following:   Height as of this encounter: 5' 6.5" (1.689 m).   Weight as of this encounter: 91.9 kg. Patient admitted for PLIF L3-4 l4-5. Patient has failed a reasonable attempt at conservative therapy.  I explained the condition and procedure to the patient and answered any questions.  Patient wishes to proceed with procedure as planned. Understands risks/ benefits and typical outcomes of procedure.   Eustace Moore 03/28/2018 7:01 AM

## 2018-03-29 LAB — GLUCOSE, CAPILLARY: Glucose-Capillary: 124 mg/dL — ABNORMAL HIGH (ref 70–99)

## 2018-03-29 MED ORDER — OXYCODONE HCL 5 MG PO TABS: 5.0000 mg | ORAL_TABLET | ORAL | 0 refills | Status: AC | PRN

## 2018-03-29 MED ORDER — METHOCARBAMOL 500 MG PO TABS: 500.0000 mg | ORAL_TABLET | Freq: Four times a day (QID) | ORAL | 0 refills | Status: AC | PRN

## 2018-03-29 NOTE — Discharge Summary (Signed)
Physician Discharge Summary  Patient ID: Jacqueline Anderson MRN: [id number] DOB/AGE: 70-Jun-1952 86 y.o.  Admit date: 03/28/2018 Discharge date: 03/29/2018  Admission Diagnoses: Lumbar spondylolisthesis, lumbago, lumbar radiculopathy  Discharge Diagnoses: The same Active Problems:   S/P lumbar fusion   Discharged Condition: good  Hospital Course: Dr. Ronnald Ramp performed a L3-4 and L4-5 decompression, instrumentation and fusion on the patient on 03/28/2018.  The patient's postoperative course was unremarkable.  On postoperative day #1 she requested discharge home.  The patient, and her husband, were given written and oral discharge instructions.  All their questions were answered.  Consults: Physical therapy Significant Diagnostic Studies: None Treatments: L3-4 and L4-5 decompression, instrumentation and fusion. Discharge Exam: Blood pressure (!) 96/36, pulse 75, temperature 98 F (36.7 C), temperature source Oral, resp. rate 16, height 5' 6.5" (1.689 m), weight 91.9 kg, SpO2 100 %. The patient is alert and pleasant.  Her dressing is clean and dry.  Her strength is normal.  Disposition: Home  Discharge Instructions    Call MD for:  difficulty breathing, headache or visual disturbances   Complete by:  As directed    Call MD for:  extreme fatigue   Complete by:  As directed    Call MD for:  hives   Complete by:  As directed    Call MD for:  persistant dizziness or light-headedness   Complete by:  As directed    Call MD for:  persistant nausea and vomiting   Complete by:  As directed    Call MD for:  redness, tenderness, or signs of infection (pain, swelling, redness, odor or green/yellow discharge around incision site)   Complete by:  As directed    Call MD for:  severe uncontrolled pain   Complete by:  As directed    Call MD for:  temperature >100.4   Complete by:  As directed    Diet - low sodium heart healthy   Complete by:  As directed    Discharge instructions   Complete  by:  As directed    Call [phone] for a followup appointment. Take a stool softener while you are using pain medications.   Driving Restrictions   Complete by:  As directed    Do not drive for 2 weeks.   Increase activity slowly   Complete by:  As directed    Lifting restrictions   Complete by:  As directed    Do not lift more than 5 pounds. No excessive bending or twisting.   May shower / Bathe   Complete by:  As directed    Remove the dressing for 3 days after surgery.  You may shower, but leave the incision alone.   Remove dressing in 48 hours   Complete by:  As directed    Your stitches are under the scan and will dissolve by themselves. The Steri-Strips will fall off after you take a few showers. Do not rub back or pick at the wound, Leave the wound alone.     Allergies as of 03/29/2018   No Known Allergies     Medication List    STOP taking these medications   diclofenac 75 MG EC tablet Commonly known as:  VOLTAREN     TAKE these medications   cetirizine 10 MG tablet Commonly known as:  ZYRTEC Take 10 mg by mouth daily.   glimepiride 4 MG tablet Commonly known as:  AMARYL Take 4 mg by mouth 2 (two) times daily.   lisinopril 20 MG  tablet Commonly known as:  PRINIVIL,ZESTRIL Take 20 mg by mouth daily.   methocarbamol 500 MG tablet Commonly known as:  ROBAXIN Take 1 tablet (500 mg total) by mouth every 6 (six) hours as needed for muscle spasms.   oxyCODONE 5 MG immediate release tablet Commonly known as:  Oxy IR/ROXICODONE Take 1-2 tablets (5-10 mg total) by mouth every 4 (four) hours as needed for moderate pain ((score 4 to 6)).   pravastatin 10 MG tablet Commonly known as:  PRAVACHOL Take 10 mg by mouth daily.   sertraline 50 MG tablet Commonly known as:  ZOLOFT Take 50-100 mg by mouth daily.   TRULICITY 1.5 ZY/6.0YT Sopn Generic drug:  Dulaglutide Inject 1.5 mg into the skin every 7 (seven) days.            Durable Medical Equipment  (From  admission, onward)         Start     Ordered   03/28/18 1255  DME Walker rolling  Once    Question:  Patient needs a walker to treat with the following condition  Answer:  S/P lumbar fusion   03/28/18 1254   03/28/18 1255  DME 3 n 1  Once     03/28/18 1254           Signed: Ophelia Charter 03/29/2018, 7:30 AM

## 2018-03-29 NOTE — Evaluation (Signed)
Occupational Therapy Evaluation Patient Details Name: Jacqueline Anderson MRN: [id number] DOB: 1950/10/19 Today's Date: 03/29/2018    History of Present Illness  33 female s/p L3-4 and L4-5 decompression. PTA including arthritis, cancer, DM, HTN, and neuromuscular disorder.   Clinical Impression   PTA, pt was living with her husband and was independent; husband recently been assisting with donning socks and shoes. Currently, pt requires Min Guard A- Min A for LB ADLs and Min Guard A functional mobility. Provided education and handout on back precautions, brace management, bed mobility, LB ADLs, toileting, and shower transfer; pt demonstrated understanding. Answered all pt questions. Recommend dc home once medically stable per physician. All acute OT needs met and will sign off. Thank you.     Follow Up Recommendations  No OT follow up;Supervision/Assistance - 24 hour    Equipment Recommendations  None recommended by OT    Recommendations for Other Services PT consult     Precautions / Restrictions Precautions Precautions: Back Precaution Booklet Issued: Yes (comment) Precaution Comments: Reviewed all back precautions Required Braces or Orthoses: Spinal Brace Spinal Brace: Lumbar corset;Applied in sitting position      Mobility Bed Mobility Overal bed mobility: Needs Assistance Bed Mobility: Rolling;Sidelying to Sit Rolling: Supervision Sidelying to sit: Supervision       General bed mobility comments: supervision for safety  Transfers Overall transfer level: Needs assistance Equipment used: None Transfers: Sit to/from Stand Sit to Stand: Min guard         General transfer comment: Min Guard A for safety    Balance Overall balance assessment: Mild deficits observed, not formally tested                                         ADL either performed or assessed with clinical judgement   ADL Overall ADL's : Needs  assistance/impaired Eating/Feeding: Independent;Sitting   Grooming: Supervision/safety;Standing   Upper Body Bathing: Supervision/ safety;Sitting   Lower Body Bathing: Min guard;Sit to/from stand Lower Body Bathing Details (indicate cue type and reason): Educating pt on compensatory techniques for adherance to back precautions Upper Body Dressing : Min guard;Sitting Upper Body Dressing Details (indicate cue type and reason): Pt demonstrating understanding of donning brace Lower Body Dressing: With caregiver independent assisting;Sit to/from stand;Min guard Lower Body Dressing Details (indicate cue type and reason): Educating pt on compensatory techniques for LB dressing. Husband assisting with donning socks and shoes. Pt able to don pants.  Toilet Transfer: Min guard;Ambulation         Tub/Shower Transfer Details (indicate cue type and reason): Discussed safe shower transfer Functional mobility during ADLs: Min guard General ADL Comments: Providing education on back precautions, brace management, bed mobility, and LB ADLs.     Vision         Perception     Praxis      Pertinent Vitals/Pain Pain Assessment: Faces Faces Pain Scale: Hurts little more Pain Location: Back Pain Descriptors / Indicators: Discomfort;Grimacing Pain Intervention(s): Monitored during session;Limited activity within patient's tolerance;Repositioned     Hand Dominance     Extremity/Trunk Assessment Upper Extremity Assessment Upper Extremity Assessment: Overall WFL for tasks assessed   Lower Extremity Assessment Lower Extremity Assessment: Defer to PT evaluation   Cervical / Trunk Assessment Cervical / Trunk Assessment: Other exceptions Cervical / Trunk Exceptions: s/p L3-4 and L4-5 decompression   Communication Communication Communication: No difficulties   Cognition  Arousal/Alertness: Awake/alert Behavior During Therapy: WFL for tasks assessed/performed Overall Cognitive Status: Within  Functional Limits for tasks assessed                                     General Comments  Husband present throughout session    Exercises     Shoulder Instructions      Home Living Family/patient expects to be discharged to:: Private residence Living Arrangements: Spouse/significant other Available Help at Discharge: Family;Available 24 hours/day Type of Home: House       Home Layout: Two level;Able to live on main level with bedroom/bathroom Alternate Level Stairs-Number of Steps: Flight   Bathroom Shower/Tub: Walk-in shower;Tub/shower unit   Bathroom Toilet: Standard                Prior Functioning/Environment Level of Independence: Needs assistance    ADL's / Homemaking Assistance Needed: Husband as been recently assisting with donning socks and shoes   Comments: Was employed at [employer] Problem List: Decreased activity tolerance;Decreased range of motion;Impaired balance (sitting and/or standing);Decreased knowledge of use of DME or AE;Decreased knowledge of precautions;Pain      OT Treatment/Interventions:      OT Goals(Current goals can be found in the care plan section) Acute Rehab OT Goals Patient Stated Goal: "Go home" OT Goal Formulation: All assessment and education complete, DC therapy  OT Frequency:     Barriers to D/C:            Co-evaluation              AM-PAC OT "6 Clicks" Daily Activity     Outcome Measure Help from another person eating meals?: None Help from another person taking care of personal grooming?: None Help from another person toileting, which includes using toliet, bedpan, or urinal?: A Little Help from another person bathing (including washing, rinsing, drying)?: A Little Help from another person to put on and taking off regular upper body clothing?: None Help from another person to put on and taking off regular lower body clothing?: A Little 6 Click Score: 21   End of Session  Equipment Utilized During Treatment: Back brace Nurse Communication: Mobility status  Activity Tolerance: Patient tolerated treatment well Patient left: in bed;with call bell/phone within reach;Other (comment);with family/visitor present(At EOB)  OT Visit Diagnosis: Unsteadiness on feet (R26.81);Other abnormalities of gait and mobility (R26.89);Muscle weakness (generalized) (M62.81);Pain Pain - part of body: (Back)                Time: 3704-8889 OT Time Calculation (min): 17 min Charges:  OT General Charges $OT Visit: 1 Visit OT Evaluation $OT Eval Low Complexity: [place], OTR/L Acute Rehab Pager: [phone] Office: [place] 03/29/2018, 9:20 AM

## 2018-03-29 NOTE — Evaluation (Signed)
Physical Therapy Evaluation Patient Details Name: Jacqueline Anderson MRN: [id number] DOB: 1950/10/13 Today's Date: 03/29/2018   History of Present Illness   70 female s/p L3-4 and L4-5 decompression. PTA including arthritis, cancer, DM, HTN, and neuromuscular disorder.  Clinical Impression  Patient evaluated by Physical Therapy with no further acute PT needs identified. Ambulating 400 feet with no assistive device and supervision for safety. Negotiated 6 steps with right railing to simulate home entrance; pt plans to live on main floor and will not be climbing steps to bedroom. Demonstrated car transfer and educated on generalized walking program. All education has been completed and the patient has no further questions. See below for any follow-up Physical Therapy or equipment needs. PT is signing off. Thank you for this referral.     Follow Up Recommendations Outpatient PT (when appropriate per surgeon)    Equipment Recommendations  None recommended by PT    Recommendations for Other Services       Precautions / Restrictions Precautions Precautions: Back Precaution Booklet Issued: Yes (comment) Precaution Comments: Reviewed all back precautions Required Braces or Orthoses: Spinal Brace Spinal Brace: Lumbar corset;Applied in sitting position Restrictions Weight Bearing Restrictions: No      Mobility  Bed Mobility Overal bed mobility: Needs Assistance Bed Mobility: Rolling;Sidelying to Sit Rolling: Supervision Sidelying to sit: Supervision       General bed mobility comments: Sitting EOB on arrival  Transfers Overall transfer level: Needs assistance Equipment used: None Transfers: Sit to/from Stand Sit to Stand: Min guard         General transfer comment: Increased time to achieve knee extension  Ambulation/Gait Ambulation/Gait assistance: Supervision Gait Distance (Feet): 400 Feet Assistive device: None Gait Pattern/deviations: Step-through  pattern;Trendelenburg;Decreased stride length     General Gait Details: Decreased reciprocal arm swing noted  Stairs Stairs: Yes Stairs assistance: Min guard Stair Management: One rail Right Number of Stairs: 6 General stair comments: Practiced sideways and forwards with step by step technique.   Wheelchair Mobility    Modified Rankin (Stroke Patients Only)       Balance Overall balance assessment: Mild deficits observed, not formally tested                                           Pertinent Vitals/Pain Pain Assessment: Faces Faces Pain Scale: Hurts little more Pain Location: Back Pain Descriptors / Indicators: Discomfort;Grimacing Pain Intervention(s): Monitored during session    Home Living Family/patient expects to be discharged to:: Private residence Living Arrangements: Spouse/significant other Available Help at Discharge: Family;Available 24 hours/day Type of Home: House       Home Layout: Two level;Able to live on main level with bedroom/bathroom        Prior Function Level of Independence: Needs assistance      ADL's / Homemaking Assistance Needed: Husband as been recently assisting with donning socks and shoes  Comments: Was employed at [employer]        Extremity/Trunk Assessment   Upper Extremity Assessment Upper Extremity Assessment: Overall WFL for tasks assessed    Lower Extremity Assessment Lower Extremity Assessment: RLE deficits/detail;LLE deficits/detail RLE Deficits / Details: Grossly 5/5 except hip flexion 3/5 (limited by pain) LLE Deficits / Details: Grossly 5/5 except hip flexion 2+/5    Cervical / Trunk Assessment Cervical / Trunk Assessment: Other exceptions Cervical / Trunk Exceptions: s/p  L3-4 and L4-5 decompression  Communication   Communication: No difficulties  Cognition Arousal/Alertness: Awake/alert Behavior During Therapy: WFL for tasks assessed/performed Overall Cognitive  Status: Within Functional Limits for tasks assessed                                        General Comments General comments (skin integrity, edema, etc.): Husband present throughout session    Exercises     Assessment/Plan    PT Assessment Patent does not need any further PT services  PT Problem List         PT Treatment Interventions      PT Goals (Current goals can be found in the Care Plan section)  Acute Rehab PT Goals Patient Stated Goal: "Go home" PT Goal Formulation: All assessment and education complete, DC therapy    Frequency     Barriers to discharge        Co-evaluation               AM-PAC PT "6 Clicks" Mobility  Outcome Measure Help needed turning from your back to your side while in a flat bed without using bedrails?: None Help needed moving from lying on your back to sitting on the side of a flat bed without using bedrails?: None Help needed moving to and from a bed to a chair (including a wheelchair)?: None Help needed standing up from a chair using your arms (e.g., wheelchair or bedside chair)?: None Help needed to walk in hospital room?: None Help needed climbing 3-5 steps with a railing? : A Little 6 Click Score: 23    End of Session Equipment Utilized During Treatment: Gait belt;Back brace Activity Tolerance: Patient tolerated treatment well Patient left: in bed;with call bell/phone within reach;with family/visitor present Nurse Communication: Mobility status PT Visit Diagnosis: Pain;Difficulty in walking, not elsewhere classified (R26.2) Pain - part of body: (back)    Time: 5427-0623 PT Time Calculation (min) (ACUTE ONLY): 16 min   Charges:   PT Evaluation $PT Eval Low Complexity: [place], PT, DPT Acute Rehabilitation Services Pager [phone] Office [phone]   Willy Eddy 03/29/2018, 9:45 AM

## 2018-03-29 NOTE — Progress Notes (Signed)
Patient alert and oriented, Jacqueline Anderson's well, voiding adequate amount of urine, swallowing without difficulty, c/o moderate pain and meds given prior to discharged for ride and discomfort. Patient discharged home with family. Script sent to pharmacyand discharged instructions given to patient . Patient and family stated understanding of instructions given. Patient has an appointment with Dr. Ronnald Ramp in two weeks.

## 2018-06-08 ENCOUNTER — Emergency Department (HOSPITAL_COMMUNITY): Payer: Medicare HMO

## 2018-06-08 ENCOUNTER — Encounter (HOSPITAL_COMMUNITY): Payer: Self-pay | Admitting: Emergency Medicine

## 2018-06-08 ENCOUNTER — Emergency Department (HOSPITAL_COMMUNITY)
Admission: EM | Admit: 2018-06-08 | Discharge: 2018-06-08 | Disposition: A | Discharge status: Home / Self Care | Payer: Medicare HMO | Attending: Emergency Medicine | Admitting: Emergency Medicine

## 2018-06-08 DIAGNOSIS — Y939 Activity, unspecified: Secondary | ICD-10-CM | POA: Diagnosis not present

## 2018-06-08 DIAGNOSIS — S52125A Nondisplaced fracture of head of left radius, initial encounter for closed fracture: Secondary | ICD-10-CM | POA: Diagnosis not present

## 2018-06-08 DIAGNOSIS — W228XXA Striking against or struck by other objects, initial encounter: Secondary | ICD-10-CM | POA: Diagnosis not present

## 2018-06-08 DIAGNOSIS — Y929 Unspecified place or not applicable: Secondary | ICD-10-CM | POA: Insufficient documentation

## 2018-06-08 DIAGNOSIS — I10 Essential (primary) hypertension: Secondary | ICD-10-CM | POA: Diagnosis not present

## 2018-06-08 DIAGNOSIS — Y999 Unspecified external cause status: Secondary | ICD-10-CM | POA: Diagnosis not present

## 2018-06-08 DIAGNOSIS — Z79899 Other long term (current) drug therapy: Secondary | ICD-10-CM | POA: Insufficient documentation

## 2018-06-08 DIAGNOSIS — E119 Type 2 diabetes mellitus without complications: Secondary | ICD-10-CM | POA: Insufficient documentation

## 2018-06-08 MED ORDER — HYDROCODONE-ACETAMINOPHEN 5-325 MG PO TABS: 1.0000 | ORAL_TABLET | ORAL | 0 refills | Status: AC | PRN

## 2018-06-08 MED ORDER — IBUPROFEN 800 MG PO TABS
800.0000 mg | ORAL_TABLET | Freq: Once | ORAL | Status: AC
  Administered 2018-06-08: 800 mg via ORAL
  Filled 2018-06-08: qty 1
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
## 2018-06-08 NOTE — Discharge Instructions (Addendum)
Wear the splint and follow-up with the orthopedic doctor.  Keep arm elevated and use ice as needed for pain and swelling.  Return to the ED sooner with worsening pain, weakness, numbness, tingling, any other concerns.

## 2018-06-08 NOTE — Progress Notes (Signed)
Orthopedic Tech Progress Note Patient Details:  Mavery Milling March 30, 1950 [id number]  Ortho Devices Type of Ortho Device: Arm sling, Post (long arm) splint Ortho Device/Splint Interventions: Adjustment, Application, Ordered   Post Interventions Patient Tolerated: Well Instructions Provided: Adjustment of device, Care of device   Melony Overly T 06/08/2018, 5:31 AM

## 2018-06-08 NOTE — ED Notes (Signed)
Patient transported to X-ray 
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
## 2018-06-08 NOTE — ED Notes (Signed)
Reviewed d/c instructions with pt, who verbalized understanding and had no outstanding questions. Unable to obtain signature d/t pad malfunction. Pt armband & labels removed and placed in shred bin. Pt departed in NAD.

## 2019-05-06 IMAGING — CT CT CERVICAL SPINE WITHOUT CONTRAST
3 of 4 series · 14 of 33 positions shown, 17 images · non-contrast
Comparison: None.

CLINICAL DATA: Fall. History of spinal fusion.

EXAM:
CT CERVICAL SPINE WITHOUT CONTRAST
TECHNIQUE: Multidetector CT imaging of the cervical spine was performed without
intravenous contrast. Multiplanar CT image reconstructions were also
generated.

[Series 4: c_spine 2.0 st · axial · 0.34mm/px · z∈[-230,-104]mm · 6 of 89 slices shown, 8 images]
[im 13/89  soft-tissue]
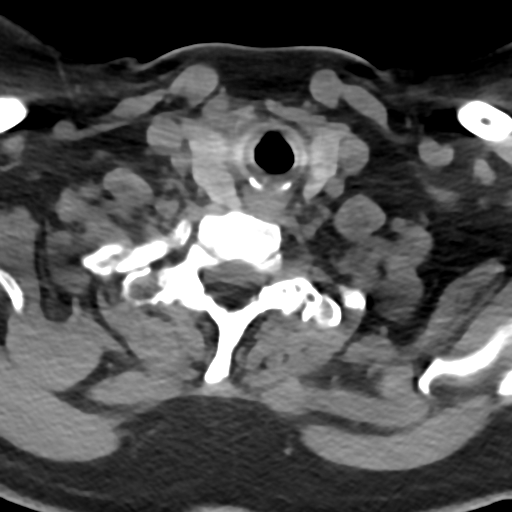
[im 13/89  bone]
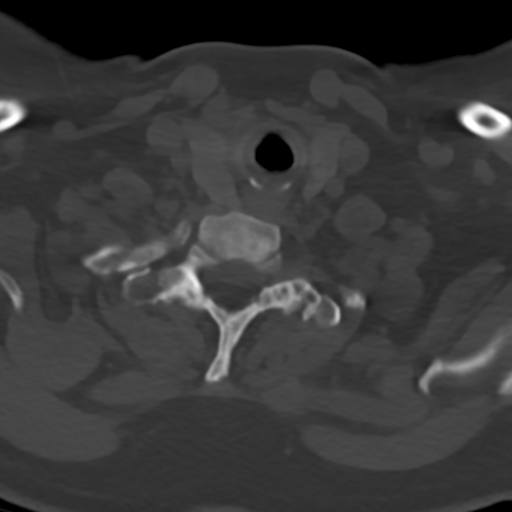
[im 26/89  bone]
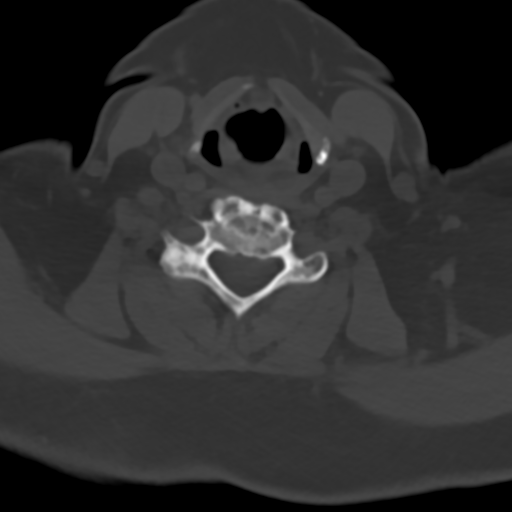
[im 38/89  bone]
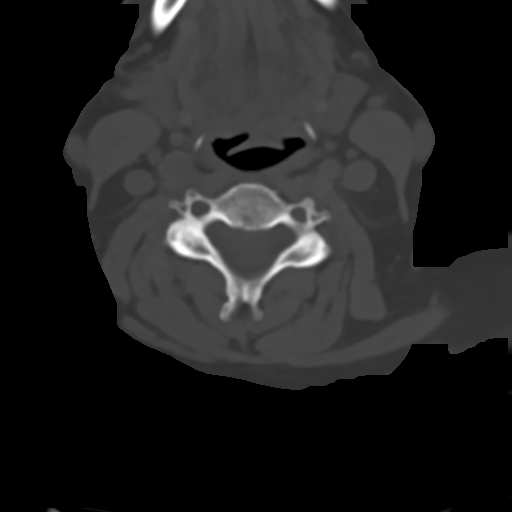
[im 51/89  bone]
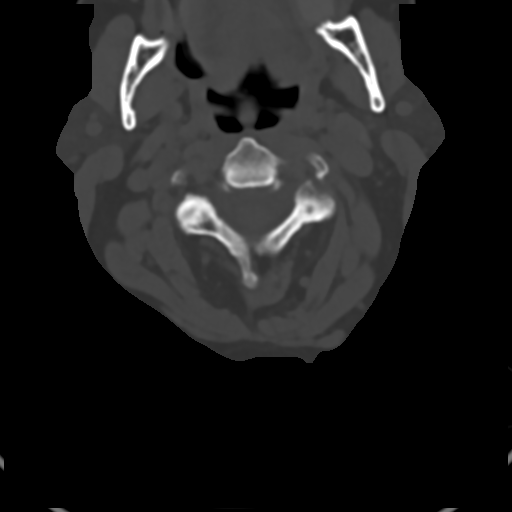
[im 63/89  soft-tissue]
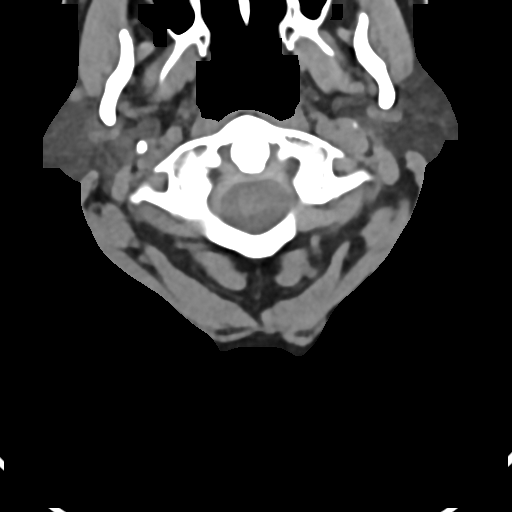
[im 63/89  bone]
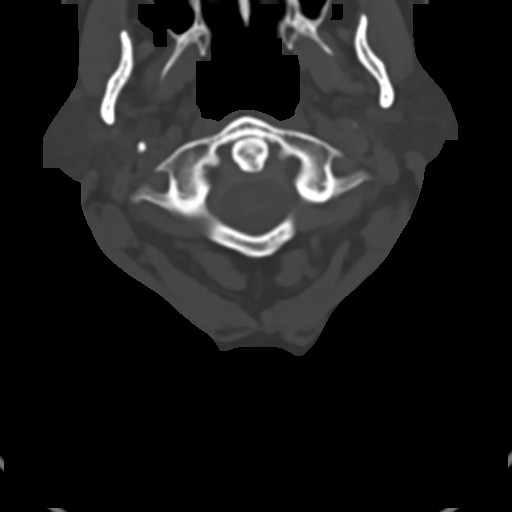
[im 76/89  bone]
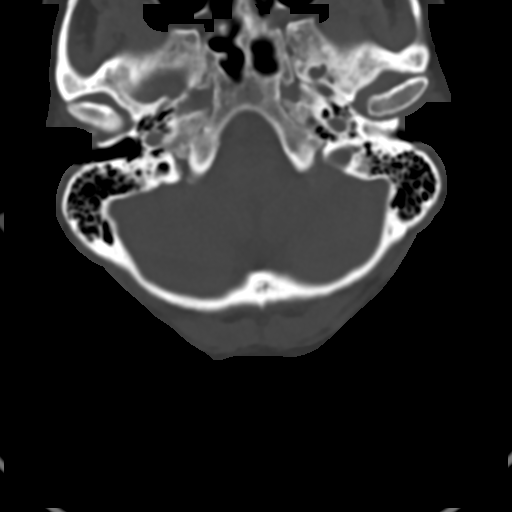

[Series 6: c_spine 2.0 sag bone · sagittal · 0.26mm/px · 5 of 61 slices shown, 6 images]
[im 21/61  bone]
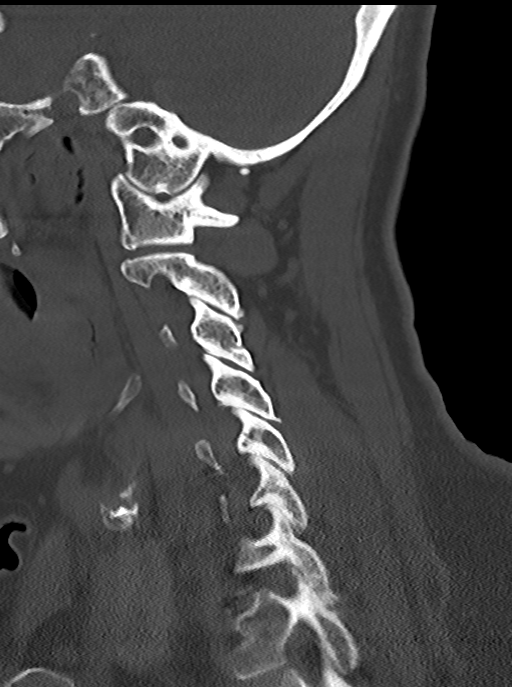
[im 26/61  bone]
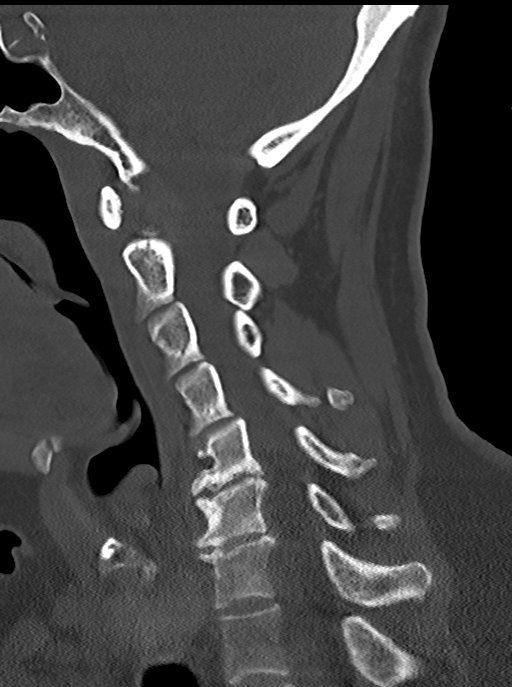
[im 31/61  soft-tissue]
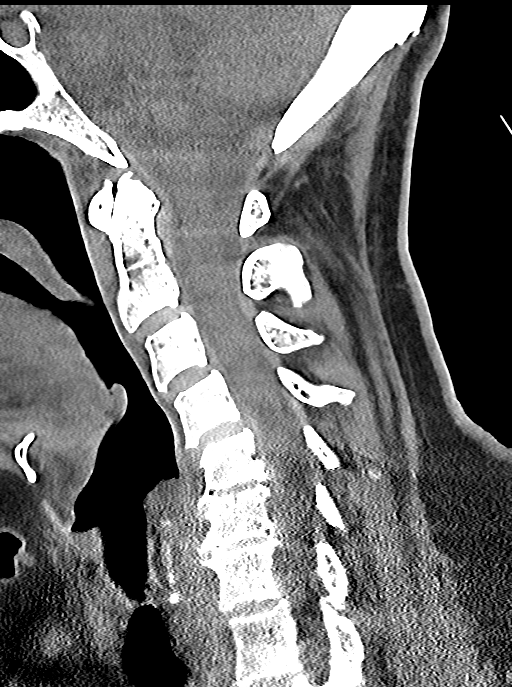
[im 31/61  bone]
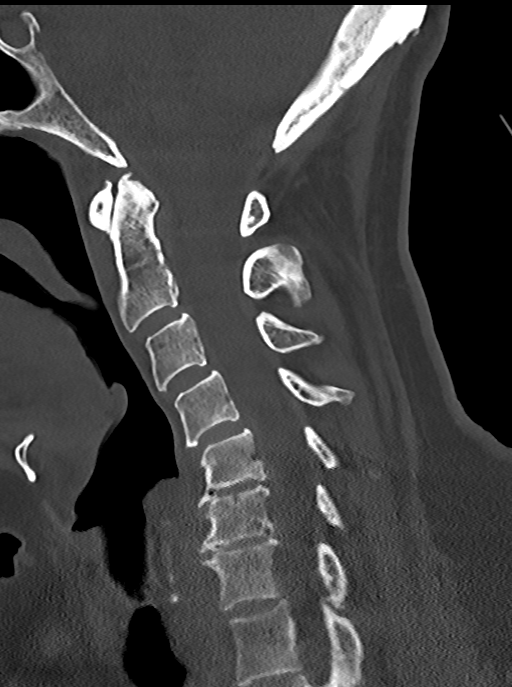
[im 36/61  bone]
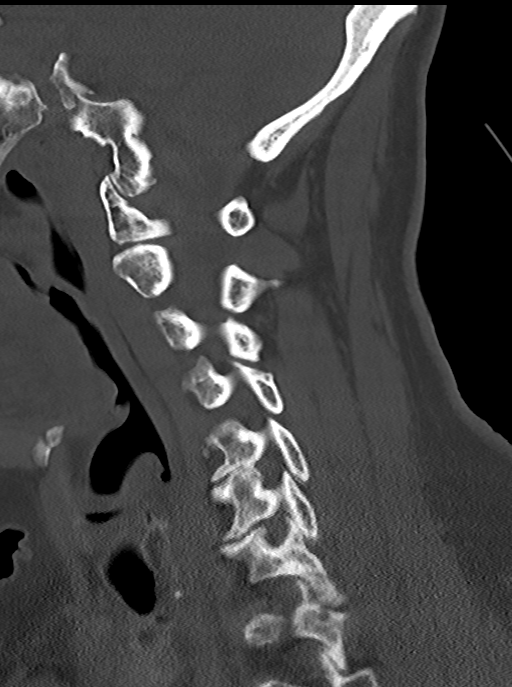
[im 41/61  bone]
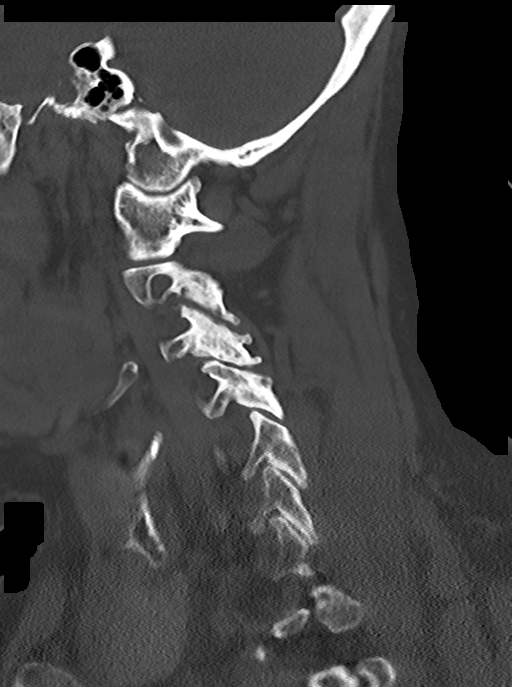

[Series 7: c_spine 2.0 cor bone · coronal · 0.26mm/px · 3 of 61 slices shown]
[im 13/61  bone]
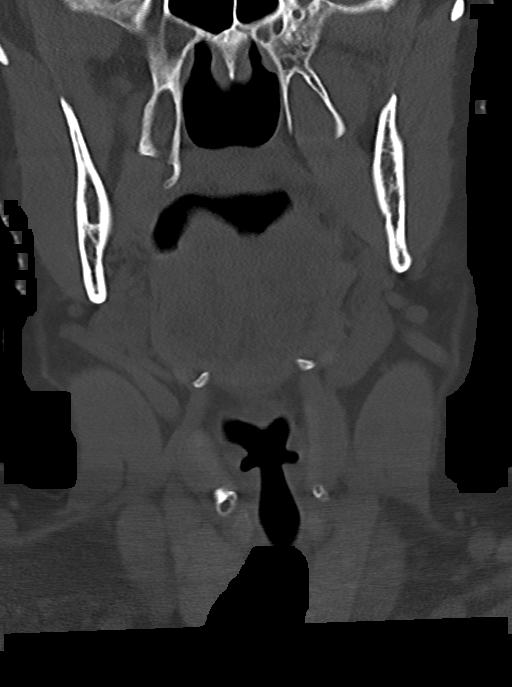
[im 25/61  bone]
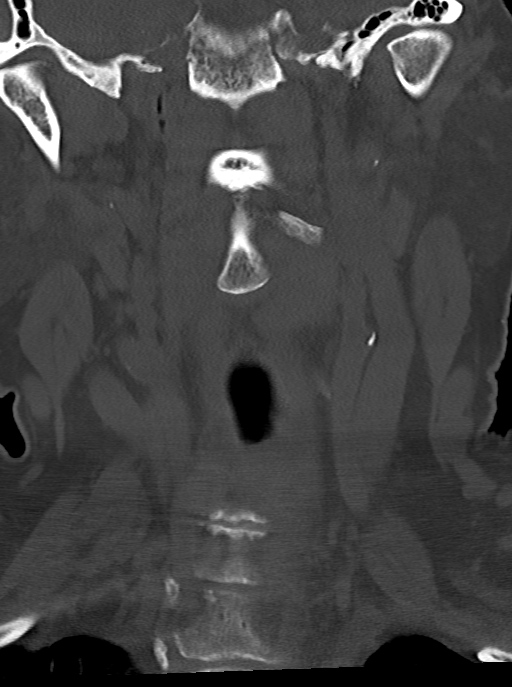
[im 37/61  bone]
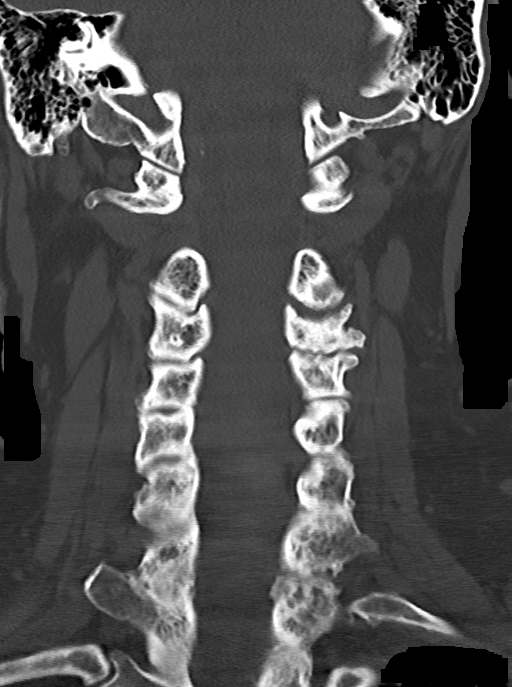

[14 of 33 positions shown; findings below may reference images not displayed]

FINDINGS: Alignment: No static subluxation. Facets are aligned. Occipital
condyles and the lateral masses of C1 and C2 are normally
approximated.

Skull base and vertebrae: No acute fracture.

Soft tissues and spinal canal: No prevertebral fluid or swelling. No
visible canal hematoma.

Disc levels: No advanced spinal canal or neural foraminal stenosis.

Upper chest: No pneumothorax, pulmonary nodule or pleural effusion.

Other: Normal visualized paraspinal cervical soft tissues.
IMPRESSION: No acute fracture or static subluxation of the cervical spine.

## 2019-05-06 IMAGING — CR LEFT WRIST - COMPLETE 3+ VIEW
4 series · 4 of 4 positions shown · non-contrast
Comparison: None.

CLINICAL DATA: Fall with wrist pain

EXAM:
LEFT WRIST - COMPLETE 3+ VIEW

[wrist pa]
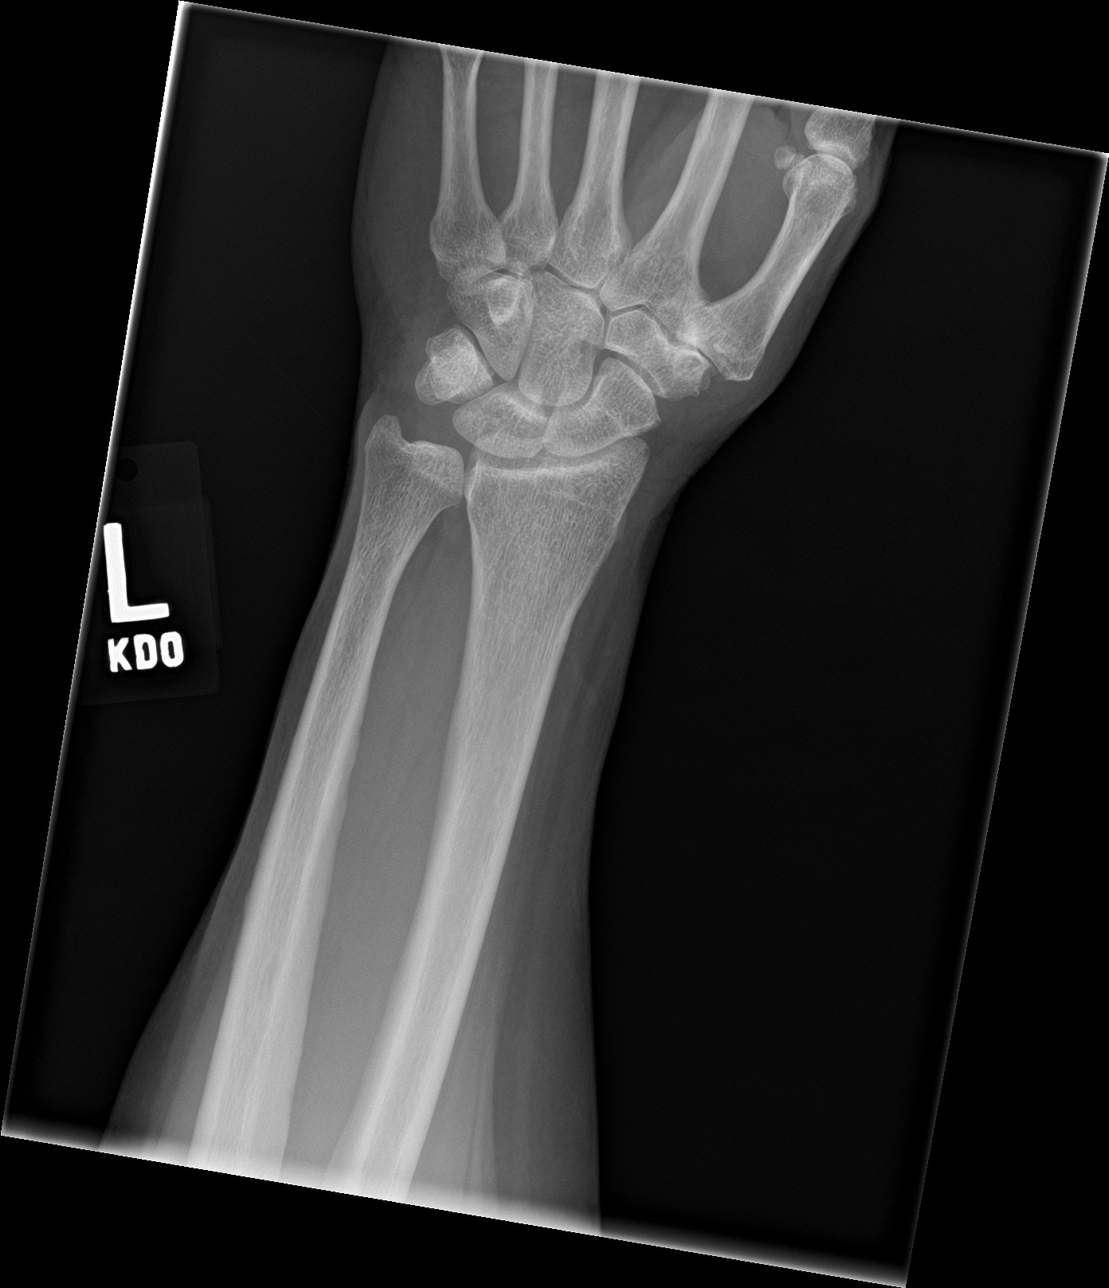

[wrist obl]
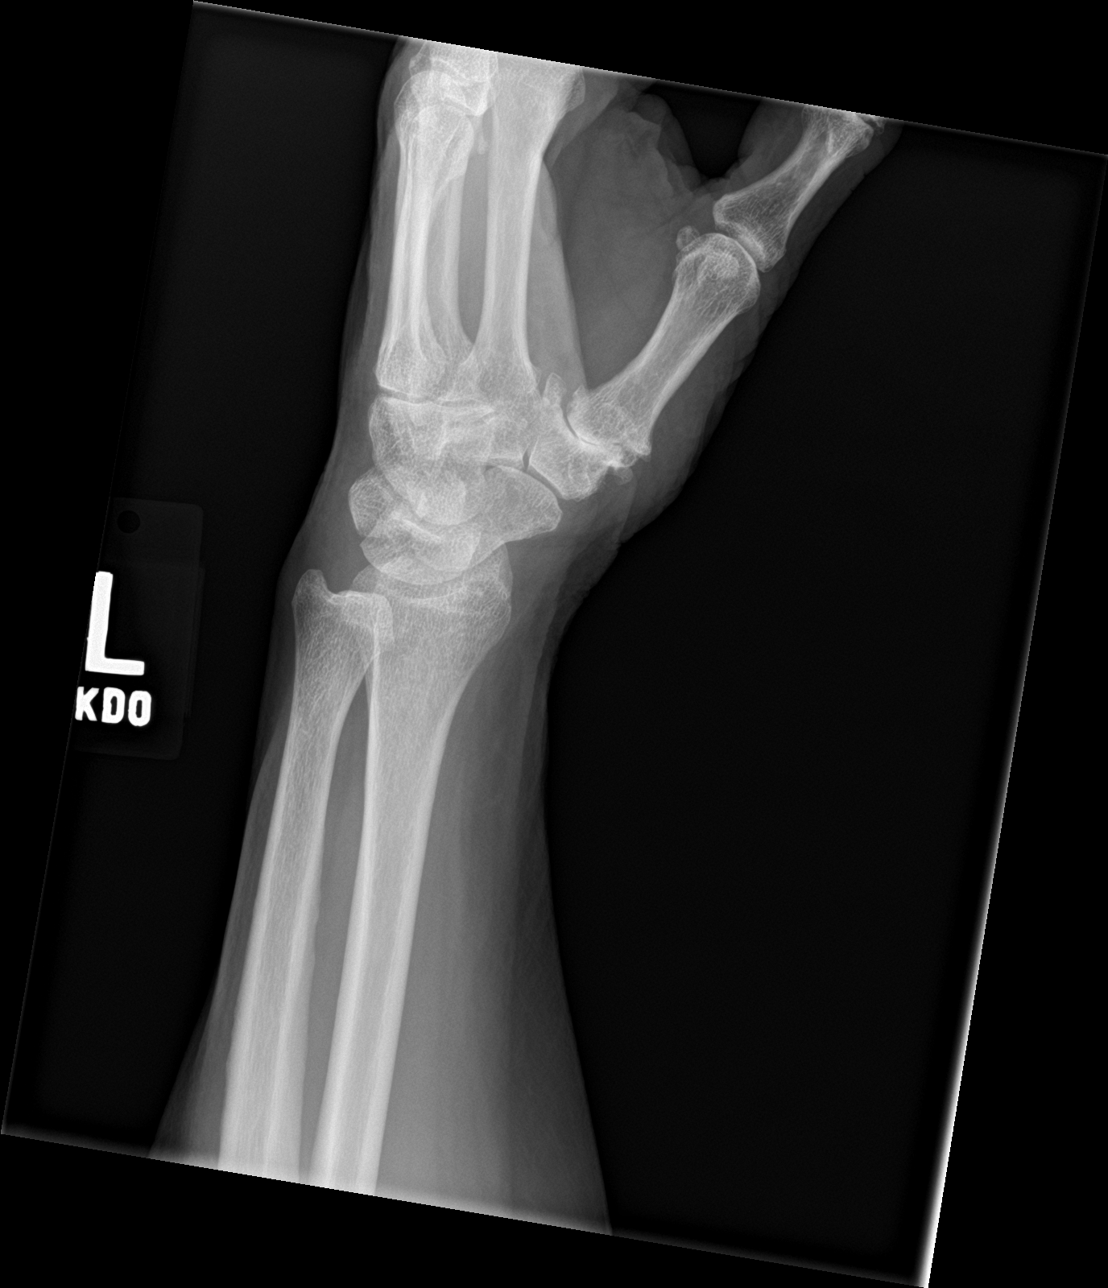

[wrist lat]
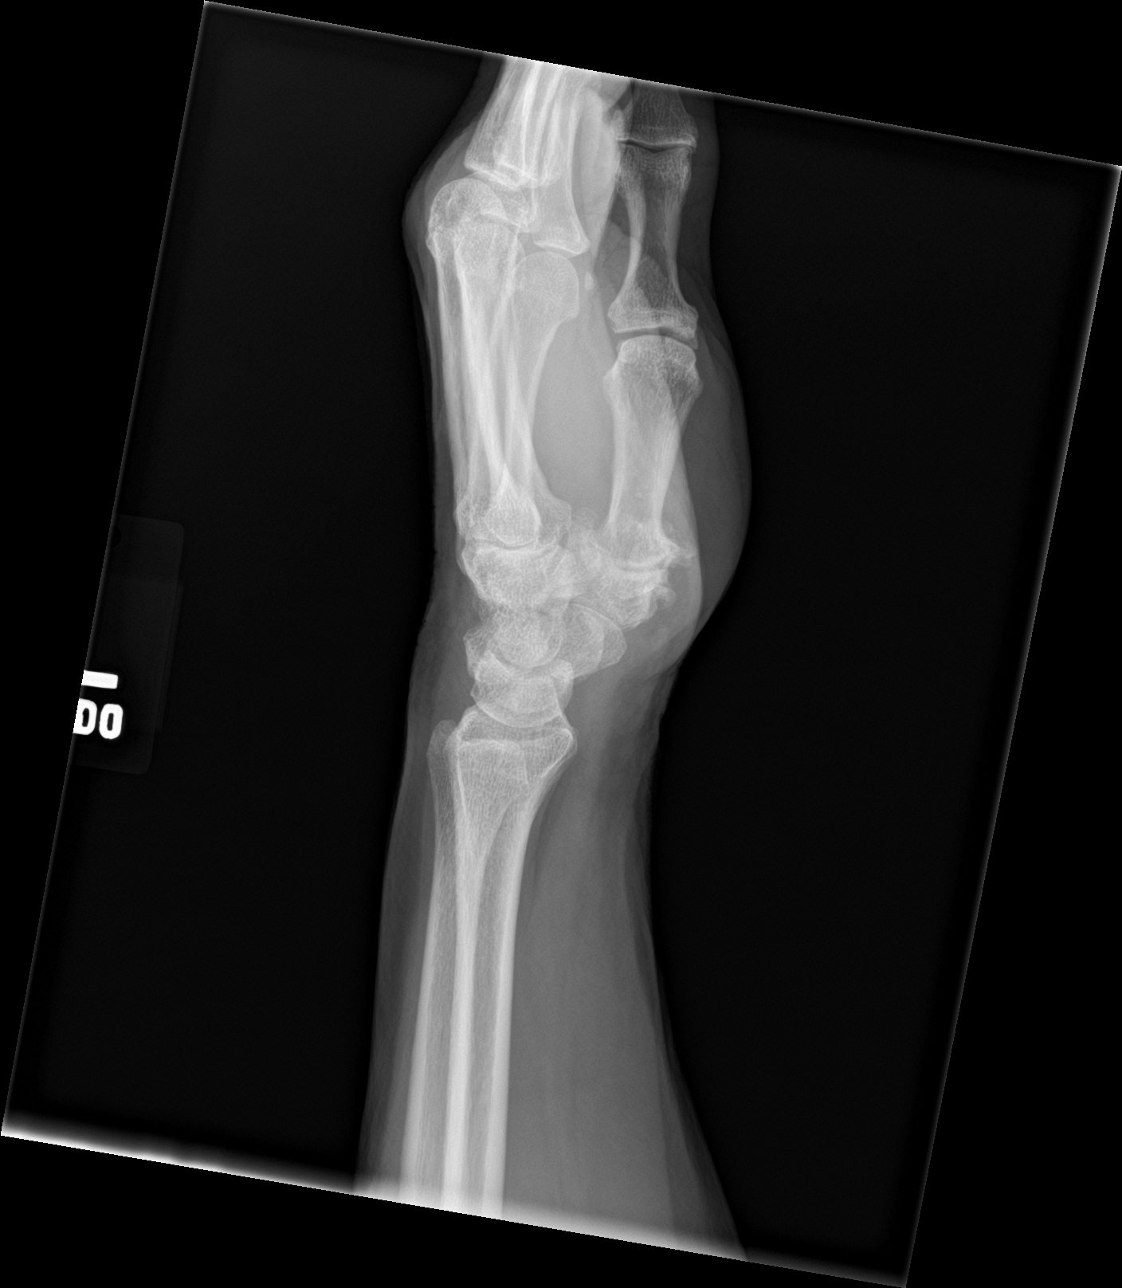

[wrist navicular]
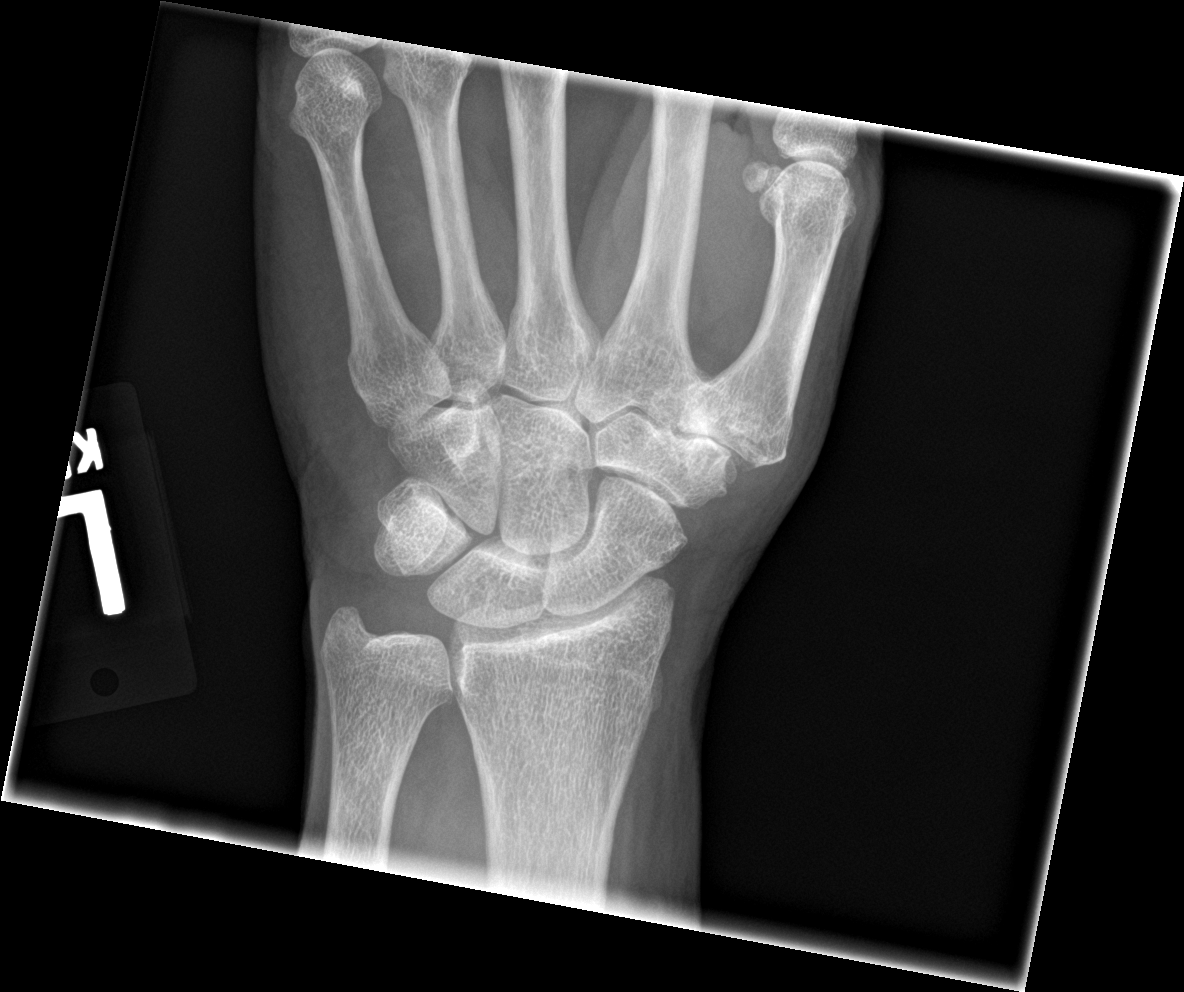

[4 of 4 positions shown; findings below may reference images not displayed]

FINDINGS: No fracture or malalignment. Moderate degenerative change at the
first [facility] joint. No radiopaque foreign body in the soft tissues.
IMPRESSION: No acute osseous abnormality.  Arthritis at the first [facility] joint
